# Patient Record
Sex: Male | Born: 1955 | Race: Black or African American | Hispanic: No | Marital: Married | Smoking: Never smoker
Health system: Southern US, Community
[De-identification: ages and names within clinical notes are randomized; demographics above are authoritative.]

## PROBLEM LIST (undated history)

## (undated) DIAGNOSIS — E785 Hyperlipidemia, unspecified: Secondary | ICD-10-CM

## (undated) DIAGNOSIS — I10 Essential (primary) hypertension: Secondary | ICD-10-CM

## (undated) DIAGNOSIS — E119 Type 2 diabetes mellitus without complications: Secondary | ICD-10-CM

## (undated) HISTORY — DX: Hyperlipidemia, unspecified: E78.5

## (undated) HISTORY — PX: NO PAST SURGERIES: SHX2092

## (undated) HISTORY — DX: Essential (primary) hypertension: I10

## (undated) HISTORY — DX: Type 2 diabetes mellitus without complications: E11.9

---

## 2003-08-04 ENCOUNTER — Other Ambulatory Visit: Payer: Self-pay

## 2005-02-22 ENCOUNTER — Emergency Department: Payer: Self-pay | Admitting: Emergency Medicine

## 2005-08-13 ENCOUNTER — Emergency Department: Payer: Self-pay | Admitting: Emergency Medicine

## 2006-07-15 ENCOUNTER — Emergency Department: Payer: Self-pay | Admitting: Emergency Medicine

## 2008-09-17 ENCOUNTER — Emergency Department: Payer: Self-pay | Admitting: Emergency Medicine

## 2009-05-31 ENCOUNTER — Emergency Department: Payer: Self-pay | Admitting: Emergency Medicine

## 2009-08-01 ENCOUNTER — Emergency Department: Payer: Self-pay | Admitting: Internal Medicine

## 2009-12-04 ENCOUNTER — Inpatient Hospital Stay: Payer: Self-pay | Admitting: Internal Medicine

## 2009-12-08 LAB — PATHOLOGY REPORT

## 2009-12-12 ENCOUNTER — Emergency Department: Payer: Self-pay | Admitting: Internal Medicine

## 2010-02-14 ENCOUNTER — Ambulatory Visit: Payer: Self-pay | Admitting: Gastroenterology

## 2010-02-14 LAB — HM COLONOSCOPY

## 2010-02-16 LAB — PATHOLOGY REPORT

## 2010-04-07 ENCOUNTER — Ambulatory Visit: Payer: Self-pay | Admitting: Gastroenterology

## 2010-11-02 ENCOUNTER — Emergency Department: Payer: Self-pay | Admitting: Emergency Medicine

## 2010-12-02 ENCOUNTER — Emergency Department: Payer: Self-pay | Admitting: Internal Medicine

## 2012-08-14 ENCOUNTER — Emergency Department: Payer: Self-pay | Admitting: Emergency Medicine

## 2013-04-25 LAB — TSH: TSH: 1.73 u[IU]/mL (ref <=5.90)

## 2013-05-02 ENCOUNTER — Emergency Department: Payer: Self-pay | Admitting: Emergency Medicine

## 2013-05-02 LAB — CBC WITH DIFFERENTIAL/PLATELET
Basophil #: 0.1 10*3/uL (ref 0.0–0.1)
Basophil %: 0.6 %
Eosinophil #: 0 10*3/uL (ref 0.0–0.7)
Eosinophil %: 0.3 %
HCT: 51.3 % (ref 40.0–52.0)
HGB: 16.3 g/dL (ref 13.0–18.0)
LYMPHS ABS: 2.6 10*3/uL (ref 1.0–3.6)
LYMPHS PCT: 29.5 %
MCH: 25.8 pg — ABNORMAL LOW (ref 26.0–34.0)
MCHC: 31.8 g/dL — AB (ref 32.0–36.0)
MCV: 81 fL (ref 80–100)
MONOS PCT: 5.2 %
Monocyte #: 0.5 x10 3/mm (ref 0.2–1.0)
NEUTROS ABS: 5.7 10*3/uL (ref 1.4–6.5)
Neutrophil %: 64.4 %
Platelet: 228 10*3/uL (ref 150–440)
RBC: 6.33 10*6/uL — AB (ref 4.40–5.90)
RDW: 15.2 % — ABNORMAL HIGH (ref 11.5–14.5)
WBC: 8.9 10*3/uL (ref 3.8–10.6)

## 2013-05-02 LAB — COMPREHENSIVE METABOLIC PANEL
ALBUMIN: 3.5 g/dL (ref 3.4–5.0)
ALT: 39 U/L (ref 12–78)
AST: 22 U/L (ref 15–37)
Alkaline Phosphatase: 66 U/L
Anion Gap: 4 — ABNORMAL LOW (ref 7–16)
BUN: 23 mg/dL — ABNORMAL HIGH (ref 7–18)
Bilirubin,Total: 0.4 mg/dL (ref 0.2–1.0)
CALCIUM: 9.7 mg/dL (ref 8.5–10.1)
CO2: 31 mmol/L (ref 21–32)
CREATININE: 1.51 mg/dL — AB (ref 0.60–1.30)
Chloride: 101 mmol/L (ref 98–107)
EGFR (Non-African Amer.): 51 — ABNORMAL LOW
GFR CALC AF AMER: 59 — AB
Glucose: 330 mg/dL — ABNORMAL HIGH (ref 65–99)
OSMOLALITY: 289 (ref 275–301)
POTASSIUM: 4.7 mmol/L (ref 3.5–5.1)
Sodium: 136 mmol/L (ref 136–145)
Total Protein: 7.6 g/dL (ref 6.4–8.2)

## 2013-05-02 LAB — URINALYSIS, COMPLETE
BACTERIA: NONE SEEN
BILIRUBIN, UR: NEGATIVE
Blood: NEGATIVE
Glucose,UR: >=500 mg/dL (ref 0–75)
Hyaline Cast: 7
KETONE: NEGATIVE
LEUKOCYTE ESTERASE: NEGATIVE
Nitrite: NEGATIVE
Ph: 5 (ref 4.5–8.0)
Protein: NEGATIVE
RBC,UR: 1 /HPF (ref 0–5)
SPECIFIC GRAVITY: 1.021 (ref 1.003–1.030)

## 2013-05-02 LAB — TROPONIN I: Troponin-I: <0.02 ng/mL

## 2013-05-02 LAB — LIPASE, BLOOD: Lipase: 177 U/L (ref 73–393)

## 2013-12-29 DIAGNOSIS — E1142 Type 2 diabetes mellitus with diabetic polyneuropathy: Secondary | ICD-10-CM | POA: Insufficient documentation

## 2013-12-29 DIAGNOSIS — E119 Type 2 diabetes mellitus without complications: Secondary | ICD-10-CM | POA: Insufficient documentation

## 2014-01-01 ENCOUNTER — Emergency Department: Payer: Self-pay | Admitting: Emergency Medicine

## 2014-01-01 LAB — CBC
HCT: 43 % (ref 40.0–52.0)
HGB: 13.5 g/dL (ref 13.0–18.0)
MCH: 25.7 pg — AB (ref 26.0–34.0)
MCHC: 31.3 g/dL — AB (ref 32.0–36.0)
MCV: 82 fL (ref 80–100)
PLATELETS: 175 10*3/uL (ref 150–440)
RBC: 5.24 10*6/uL (ref 4.40–5.90)
RDW: 14.2 % (ref 11.5–14.5)
WBC: 7.4 10*3/uL (ref 3.8–10.6)

## 2014-01-01 LAB — BASIC METABOLIC PANEL
ANION GAP: 9 (ref 7–16)
BUN: 26 mg/dL — AB (ref 7–18)
CALCIUM: 8.2 mg/dL — AB (ref 8.5–10.1)
CHLORIDE: 99 mmol/L (ref 98–107)
Co2: 28 mmol/L (ref 21–32)
Creatinine: 1.52 mg/dL — ABNORMAL HIGH (ref 0.60–1.30)
EGFR (African American): >60
GFR CALC NON AF AMER: 51 — AB
Glucose: 333 mg/dL — ABNORMAL HIGH (ref 65–99)
OSMOLALITY: 290 (ref 275–301)
Potassium: 4.2 mmol/L (ref 3.5–5.1)
Sodium: 136 mmol/L (ref 136–145)

## 2014-01-01 LAB — TROPONIN I: Troponin-I: <0.02 ng/mL

## 2014-01-16 LAB — CBC AND DIFFERENTIAL
HEMATOCRIT: 40 % — AB (ref 41–53)
HEMOGLOBIN: 13.2 g/dL — AB (ref 13.5–17.5)
PLATELETS: 208 10*3/uL (ref 150–399)
WBC: 4.9 10^3/mL

## 2014-01-16 LAB — LIPID PANEL
CHOLESTEROL: 225 mg/dL — AB (ref 0–200)
HDL: 48 mg/dL (ref 35–70)
LDL CALC: 158 mg/dL
TRIGLYCERIDES: 94 mg/dL (ref 40–160)

## 2014-01-16 LAB — BASIC METABOLIC PANEL
BUN: 16 mg/dL (ref 4–21)
Creatinine: 1.1 mg/dL (ref 0.6–1.3)
Glucose: 183 mg/dL
POTASSIUM: 5 mmol/L (ref 3.4–5.3)
SODIUM: 140 mmol/L (ref 137–147)

## 2014-01-16 LAB — HEPATIC FUNCTION PANEL
ALT: 29 U/L (ref 10–40)
AST: 21 U/L (ref 14–40)

## 2014-01-19 ENCOUNTER — Ambulatory Visit: Payer: Self-pay | Admitting: Family Medicine

## 2014-10-29 ENCOUNTER — Encounter: Payer: Self-pay | Admitting: Family Medicine

## 2014-12-21 DIAGNOSIS — E7849 Other hyperlipidemia: Secondary | ICD-10-CM | POA: Insufficient documentation

## 2014-12-21 DIAGNOSIS — I10 Essential (primary) hypertension: Secondary | ICD-10-CM | POA: Insufficient documentation

## 2015-01-13 ENCOUNTER — Encounter: Payer: Self-pay | Admitting: Family Medicine

## 2015-01-13 ENCOUNTER — Ambulatory Visit (INDEPENDENT_AMBULATORY_CARE_PROVIDER_SITE_OTHER): Payer: 59 | Admitting: Family Medicine

## 2015-01-13 VITALS — BP 112/70 | HR 110 | Temp 97.9°F | Resp 16 | Ht 72.0 in | Wt 264.0 lb

## 2015-01-13 DIAGNOSIS — E785 Hyperlipidemia, unspecified: Secondary | ICD-10-CM | POA: Diagnosis not present

## 2015-01-13 DIAGNOSIS — I10 Essential (primary) hypertension: Secondary | ICD-10-CM

## 2015-01-13 DIAGNOSIS — E1169 Type 2 diabetes mellitus with other specified complication: Secondary | ICD-10-CM

## 2015-01-13 DIAGNOSIS — Z6829 Body mass index (BMI) 29.0-29.9, adult: Secondary | ICD-10-CM | POA: Diagnosis not present

## 2015-01-13 DIAGNOSIS — G4700 Insomnia, unspecified: Secondary | ICD-10-CM | POA: Insufficient documentation

## 2015-01-13 DIAGNOSIS — R748 Abnormal levels of other serum enzymes: Secondary | ICD-10-CM | POA: Insufficient documentation

## 2015-01-13 DIAGNOSIS — K259 Gastric ulcer, unspecified as acute or chronic, without hemorrhage or perforation: Secondary | ICD-10-CM | POA: Insufficient documentation

## 2015-01-13 DIAGNOSIS — M1990 Unspecified osteoarthritis, unspecified site: Secondary | ICD-10-CM | POA: Insufficient documentation

## 2015-01-13 DIAGNOSIS — E119 Type 2 diabetes mellitus without complications: Secondary | ICD-10-CM | POA: Insufficient documentation

## 2015-01-13 DIAGNOSIS — S39012A Strain of muscle, fascia and tendon of lower back, initial encounter: Secondary | ICD-10-CM | POA: Insufficient documentation

## 2015-01-13 DIAGNOSIS — D649 Anemia, unspecified: Secondary | ICD-10-CM | POA: Insufficient documentation

## 2015-01-13 MED ORDER — ATORVASTATIN CALCIUM 40 MG PO TABS: 40.0000 mg | ORAL_TABLET | Freq: Every day | ORAL | Status: DC

## 2015-01-13 NOTE — Progress Notes (Signed)
Patient ID: Sean Roy, male   DOB: 05-24-1955, 59 y.o.   MRN: [id number]        Patient: Sean Roy Male    DOB: 05-24-1955   59 y.o.   MRN: [id number] Visit Date: 01/13/2015  Today's Provider: Lorie PhenixNancy Parissa Chiao, MD   Chief Complaint  Patient presents with  . Obesity   Subjective:    HPI Obesity: Patient complains of obesity. Patient cites health as reasons for wanting to lose weight. Patient reports he  needs form filled out for insurance. Patient reports he is very active at work. Patient reports his diet in general is healthy. Patient reports that he would like to lose around 25 lbs.    Has been taking his mediation for diabetes.  Has had no low sugars. Is not checking his sugars regularly. Has not followed up with endocrinology.  Really does not know how his diabetes has been.   Also, ran out of his cholesterol medication and has not taken it recently.  Has not had his labs done recently.      Lab Results  Component Value Date   WBC 4.9 01/16/2014   HGB 13.2* 01/16/2014   HCT 40* 01/16/2014   PLT 208 01/16/2014   GLUCOSE 333* 01/01/2014   CHOL 225* 01/16/2014   TRIG 94 01/16/2014   HDL 48 01/16/2014   LDLCALC 158 01/16/2014   ALT 29 01/16/2014   AST 21 01/16/2014   NA 140 01/16/2014   K 5.0 01/16/2014   CL 99 01/01/2014   CREATININE 1.1 01/16/2014   BUN 16 01/16/2014   CO2 28 01/01/2014   TSH 1.73 04/25/2013     No Known Allergies Previous Medications   ATORVASTATIN (LIPITOR) 40 MG TABLET    Take 40 mg by mouth daily at 6 PM.    GLUCOSE BLOOD (ONE TOUCH ULTRA TEST) TEST STRIP    Use 1 strip  once a day  [DX 250.00]   LISINOPRIL-HYDROCHLOROTHIAZIDE (PRINZIDE,ZESTORETIC) 20-12.5 MG TABLET    Take 1 tablet by mouth. Two times a day   METFORMIN (GLUCOPHAGE) 1000 MG TABLET    Take 1,000 mg by mouth daily with breakfast.     Review of Systems  Constitutional: Negative.   Respiratory: Negative.   Cardiovascular: Negative.   Endocrine: Negative.     Psychiatric/Behavioral: Negative.     Social History  Substance Use Topics  . Smoking status: Never Smoker   . Smokeless tobacco: Never Used  . Alcohol Use: 1.8 oz/week    3 Shots of liquor per week     Comment: occasional   Objective:   BP 112/70 mmHg  Pulse 110  Temp(Src) 97.9 F (36.6 C) (Oral)  Resp 16  Ht 6' (1.829 m)  Wt 264 lb (119.75 kg)  BMI 35.80 kg/m2  SpO2 97%  Physical Exam  Constitutional: He is oriented to person, place, and time. He appears well-developed and well-nourished.  Cardiovascular: Normal rate and regular rhythm.   Pulmonary/Chest: Effort normal and breath sounds normal.  Neurological: He is alert and oriented to person, place, and time.  Psychiatric: He has a normal mood and affect. His behavior is normal. Judgment and thought content normal.      Assessment & Plan:     1. Body mass index (BMI) of 29.0-29.9 in adult Elevated. Motivated to loose weight. Will eat healthy and exercise. Monitor his blood sugars.    2. Essential hypertension Stable. Continue current medication and plan of care.  - CBC with Differential/Platelet  3.  Type 2 diabetes mellitus with other specified complication (HCC) Has not followed up with endocrinology as he was supposed to Stressed again the seriousness of uncontrolled diabetes. Has a long history of non-compliance. Not sure to emphasize the importance of compliance.  Will check labs. - Hemoglobin A1c  4. HLD (hyperlipidemia) Stable.  Will refill medication and check labs.   - Lipid panel - Comprehensive metabolic panel - atorvastatin (LIPITOR) 40 MG tablet; Take 1 tablet (40 mg total) by mouth daily at 6 PM.  Dispense: 90 tablet; Refill: 3    Lorie Phenix, MD  [facility]

## 2015-01-22 ENCOUNTER — Other Ambulatory Visit: Payer: Self-pay | Admitting: Family Medicine

## 2015-01-22 ENCOUNTER — Telehealth: Payer: Self-pay

## 2015-01-22 DIAGNOSIS — E785 Hyperlipidemia, unspecified: Secondary | ICD-10-CM

## 2015-01-22 LAB — LIPID PANEL
CHOLESTEROL TOTAL: 251 mg/dL — AB (ref 100–199)
Chol/HDL Ratio: 6.6 ratio units — ABNORMAL HIGH (ref 0.0–5.0)
HDL: 38 mg/dL — AB (ref >39)
LDL Calculated: 191 mg/dL — ABNORMAL HIGH (ref 0–99)
TRIGLYCERIDES: 112 mg/dL (ref 0–149)
VLDL Cholesterol Cal: 22 mg/dL (ref 5–40)

## 2015-01-22 LAB — CBC WITH DIFFERENTIAL/PLATELET
BASOS: 1 %
Basophils Absolute: 0 10*3/uL (ref 0.0–0.2)
EOS (ABSOLUTE): 0.2 10*3/uL (ref 0.0–0.4)
EOS: 4 %
HEMATOCRIT: 40.5 % (ref 37.5–51.0)
HEMOGLOBIN: 13.2 g/dL (ref 12.6–17.7)
Immature Grans (Abs): 0 10*3/uL (ref 0.0–0.1)
Immature Granulocytes: 0 %
LYMPHS ABS: 3.2 10*3/uL — AB (ref 0.7–3.1)
Lymphs: 54 %
MCH: 26.1 pg — AB (ref 26.6–33.0)
MCHC: 32.6 g/dL (ref 31.5–35.7)
MCV: 80 fL (ref 79–97)
MONOCYTES: 7 %
MONOS ABS: 0.4 10*3/uL (ref 0.1–0.9)
NEUTROS ABS: 2 10*3/uL (ref 1.4–7.0)
Neutrophils: 34 %
Platelets: 201 10*3/uL (ref 150–379)
RBC: 5.05 x10E6/uL (ref 4.14–5.80)
RDW: 14.3 % (ref 12.3–15.4)
WBC: 5.9 10*3/uL (ref 3.4–10.8)

## 2015-01-22 LAB — COMPREHENSIVE METABOLIC PANEL
A/G RATIO: 1.4 (ref 1.1–2.5)
ALT: 32 IU/L (ref 0–44)
AST: 22 IU/L (ref 0–40)
Albumin: 4.3 g/dL (ref 3.5–5.5)
Alkaline Phosphatase: 53 IU/L (ref 39–117)
BILIRUBIN TOTAL: 0.3 mg/dL (ref 0.0–1.2)
BUN/Creatinine Ratio: 20 (ref 9–20)
BUN: 25 mg/dL — ABNORMAL HIGH (ref 6–24)
CALCIUM: 9.4 mg/dL (ref 8.7–10.2)
CHLORIDE: 99 mmol/L (ref 97–106)
CO2: 24 mmol/L (ref 18–29)
Creatinine, Ser: 1.26 mg/dL (ref 0.76–1.27)
GFR calc non Af Amer: 63 mL/min/{1.73_m2} (ref >59)
GFR, EST AFRICAN AMERICAN: 73 mL/min/{1.73_m2} (ref >59)
GLOBULIN, TOTAL: 3.1 g/dL (ref 1.5–4.5)
Glucose: 201 mg/dL — ABNORMAL HIGH (ref 65–99)
POTASSIUM: 4.6 mmol/L (ref 3.5–5.2)
SODIUM: 139 mmol/L (ref 136–144)
TOTAL PROTEIN: 7.4 g/dL (ref 6.0–8.5)

## 2015-01-22 LAB — HEMOGLOBIN A1C
Est. average glucose Bld gHb Est-mCnc: 269 mg/dL
Hgb A1c MFr Bld: 11 % — ABNORMAL HIGH (ref 4.8–5.6)

## 2015-01-22 MED ORDER — ATORVASTATIN CALCIUM 40 MG PO TABS: 40.0000 mg | ORAL_TABLET | Freq: Every day | ORAL | Status: DC

## 2015-01-22 NOTE — Telephone Encounter (Signed)
-----   Message from Lorie PhenixNancy Maloney, MD sent at 01/22/2015  9:57 AM EDT ----- Labs look terrible as suspected. Restart cholesterol medication and recheck labs in 6 weeks. Blood sugar is high at 11.  Follow with endocrinologist as scheduled. Thanks.

## 2015-01-22 NOTE — Telephone Encounter (Signed)
Pt stated that we sent the RX for atorvastatin (LIPITOR) 40 MG tablet to [facility] as he requested but [facility] advised pt that he needs to use [facility] mail order for his insurance to cover the medication. Pt is requesting we send it to [facility]. Thanks TNP

## 2015-01-22 NOTE — Telephone Encounter (Signed)
Pt advised as directed below.  He has already started his cholesterol medication.  He agreed to call back around mid December to recheck labs.  He has not set up an appointment yet with endocrinology; but he says he will give them a call.   Thanks,   -Vernona RiegerLaura

## 2015-04-28 ENCOUNTER — Telehealth: Payer: Self-pay | Admitting: Family Medicine

## 2015-04-28 NOTE — Telephone Encounter (Signed)
It appears patient is past due for follow up appointment and labs. Patient scheduled for follow up on 05/04/2015.

## 2015-04-28 NOTE — Telephone Encounter (Signed)
Pt would like to know when he should return for a f/u on his cholesterol and if he will need blood work. Please advise. Thanks TNP

## 2015-05-04 ENCOUNTER — Ambulatory Visit (INDEPENDENT_AMBULATORY_CARE_PROVIDER_SITE_OTHER): Payer: 59 | Admitting: Family Medicine

## 2015-05-04 ENCOUNTER — Encounter: Payer: Self-pay | Admitting: Family Medicine

## 2015-05-04 VITALS — BP 118/84 | HR 84 | Temp 98.0°F | Resp 16 | Wt 260.0 lb

## 2015-05-04 DIAGNOSIS — Z23 Encounter for immunization: Secondary | ICD-10-CM | POA: Diagnosis not present

## 2015-05-04 DIAGNOSIS — R748 Abnormal levels of other serum enzymes: Secondary | ICD-10-CM | POA: Diagnosis not present

## 2015-05-04 DIAGNOSIS — E785 Hyperlipidemia, unspecified: Secondary | ICD-10-CM

## 2015-05-04 NOTE — Progress Notes (Signed)
Subjective:    Patient ID: Sean Roy, male    DOB: 02-Oct-1955, 60 y.o.   MRN: [id number]  Hyperlipidemia This is a chronic problem. Recent lipid tests were reviewed and are high (Last checked 01/21/2015. Total- 251, Trig- 112, HDL- 38, LDL- 191). Pertinent negatives include no chest pain, focal sensory loss, focal weakness, leg pain, myalgias or shortness of breath. Current antihyperlipidemic treatment includes statins (Lipitor 40 mg). There are no compliance problems.  Risk factors for coronary artery disease include diabetes mellitus, dyslipidemia, male sex and hypertension.  Hypertension This is a chronic problem. The problem is controlled. Pertinent negatives include no anxiety, blurred vision, chest pain, headaches, malaise/fatigue, neck pain, orthopnea, palpitations, peripheral edema, shortness of breath or sweats. Treatments tried: Lisinopril 20-12.5 mg. The current treatment provides moderate improvement. There are no compliance problems.       Review of Systems  Constitutional: Negative for malaise/fatigue.  Eyes: Negative for blurred vision.  Respiratory: Negative for shortness of breath.   Cardiovascular: Negative for chest pain, palpitations and orthopnea.  Musculoskeletal: Negative for myalgias and neck pain.  Neurological: Negative for focal weakness and headaches.   BP 118/84 mmHg  Pulse 84  Temp(Src) 98 F (36.7 C) (Oral)  Resp 16  Wt 260 lb (117.935 kg)   Patient Active Problem List   Diagnosis Date Noted  . Absolute anemia 01/13/2015  . Body mass index (BMI) of 29.0-29.9 in adult 01/13/2015  . Elevated CK 01/13/2015  . Gastric ulcer 01/13/2015  . HLD (hyperlipidemia) 01/13/2015  . BP (high blood pressure) 01/13/2015  . Cannot sleep 01/13/2015  . Back strain 01/13/2015  . Arthritis, degenerative 01/13/2015  . Essential (primary) hypertension 12/21/2014  . 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 Type 2 diabetes mellitus (HCC)  12/29/2013   Past Medical History  Diagnosis Date  . Diabetes mellitus without complication (HCC)   . Hyperlipidemia   . Hypertension    Current Outpatient Prescriptions on File Prior to Visit  Medication Sig  . atorvastatin (LIPITOR) 40 MG tablet Take 1 tablet (40 mg total) by mouth daily.  Marland Kitchen glucose blood (ONE TOUCH ULTRA TEST) test strip Use 1 strip  once a day  [DX 250.00]  . lisinopril-hydrochlorothiazide (PRINZIDE,ZESTORETIC) 20-12.5 MG tablet Take 1 tablet by mouth. Two times a day  . metFORMIN (GLUCOPHAGE) 1000 MG tablet Take 1,000 mg by mouth daily with breakfast.    No current facility-administered medications on file prior to visit.   No Known Allergies Past Surgical History  Procedure Laterality Date  . No past surgeries     Social History   Social History  . Marital Status: Married    Spouse Name: N/A  . Number of Children: N/A  . Years of Education: N/A   Occupational History  . Not on file.   Social History Main Topics  . Smoking status: Never Smoker   . Smokeless tobacco: Never Used  . Alcohol Use: 1.8 oz/week    3 Shots of liquor per week     Comment: occasional  . Drug Use: No  . Sexual Activity: Not on file   Other Topics Concern  . Not on file   Social History Narrative   Family History  Problem Relation Age of Onset  . Healthy Sister   . Healthy Brother   . Healthy Brother   . Healthy Sister       Objective:   Physical Exam  Constitutional: He is oriented to person, place, and time. He appears well-developed  and well-nourished.  Cardiovascular: Normal rate and regular rhythm.   Pulmonary/Chest: Effort normal and breath sounds normal.  Neurological: He is alert and oriented to person, place, and time.  Psychiatric: He has a normal mood and affect. His behavior is normal. Judgment and thought content normal.   BP 118/84 mmHg  Pulse 84  Temp(Src) 98 F (36.7 C) (Oral)  Resp 16  Wt 260 lb (117.935 kg)     Assessment & Plan:  1.  HLD (hyperlipidemia) Condition is stable. Please continue current medication and  plan of care as noted.  Will check labs.   - Comprehensive metabolic panel - Lipid panel  2. Elevated CK Will  Check today. Always.   - CK (Creatine Kinase)  3. Need for pneumococcal vaccination Given today.  - Pneumococcal polysaccharide vaccine 23-valent greater than or equal to 2yo subcutaneous/IM   Patient was seen and examined by Leo Grosser, MD, and note scribed by Allene Dillon, CMA. I have reviewed the document for accuracy and completeness and I agree with above. Leo Grosser, MD   Lorie Phenix, MD

## 2015-05-05 ENCOUNTER — Other Ambulatory Visit: Payer: Self-pay | Admitting: Family Medicine

## 2015-05-06 ENCOUNTER — Telehealth: Payer: Self-pay

## 2015-05-06 LAB — COMPREHENSIVE METABOLIC PANEL
ALBUMIN: 4.1 g/dL (ref 3.5–5.5)
ALK PHOS: 57 IU/L (ref 39–117)
ALT: 27 IU/L (ref 0–44)
AST: 23 IU/L (ref 0–40)
Albumin/Globulin Ratio: 1.3 (ref 1.1–2.5)
BUN/Creatinine Ratio: 24 — ABNORMAL HIGH (ref 9–20)
BUN: 24 mg/dL (ref 6–24)
CHLORIDE: 102 mmol/L (ref 96–106)
CO2: 24 mmol/L (ref 18–29)
CREATININE: 1.01 mg/dL (ref 0.76–1.27)
Calcium: 9.6 mg/dL (ref 8.7–10.2)
GFR calc Af Amer: 95 mL/min/{1.73_m2} (ref >59)
GFR calc non Af Amer: 82 mL/min/{1.73_m2} (ref >59)
GLUCOSE: 80 mg/dL (ref 65–99)
Globulin, Total: 3.2 g/dL (ref 1.5–4.5)
Potassium: 4.8 mmol/L (ref 3.5–5.2)
Sodium: 143 mmol/L (ref 134–144)
Total Protein: 7.3 g/dL (ref 6.0–8.5)

## 2015-05-06 LAB — LIPID PANEL
CHOLESTEROL TOTAL: 129 mg/dL (ref 100–199)
Chol/HDL Ratio: 3.6 ratio units (ref 0.0–5.0)
HDL: 36 mg/dL — ABNORMAL LOW (ref >39)
LDL CALC: 78 mg/dL (ref 0–99)
TRIGLYCERIDES: 77 mg/dL (ref 0–149)
VLDL CHOLESTEROL CAL: 15 mg/dL (ref 5–40)

## 2015-05-06 LAB — CK: Total CK: 731 U/L (ref 24–204)

## 2015-05-06 NOTE — Telephone Encounter (Signed)
-----   Message from Lorie Phenix, MD sent at 05/06/2015  2:14 PM EST ----- Labs look great except CK is up like previous. Would stop  Atorvastatin short term and recheck Ck and Ck MB in 2 weeks. Will decide what to do about statin after that.  Very pleased got his labs.  Look great otherwise.   Labs do not have to be fasting. Thanks.

## 2015-05-06 NOTE — Telephone Encounter (Signed)
Advised pt of lab results. Pt verbally acknowledges understanding. Sean Roy, CMA   

## 2015-05-19 ENCOUNTER — Other Ambulatory Visit: Payer: Self-pay

## 2015-05-19 ENCOUNTER — Telehealth: Payer: Self-pay | Admitting: Family Medicine

## 2015-05-19 DIAGNOSIS — R748 Abnormal levels of other serum enzymes: Secondary | ICD-10-CM

## 2015-05-19 NOTE — Telephone Encounter (Signed)
Pt is requesting a call back.  ZO#[phone]/[phone]

## 2015-05-19 NOTE — Telephone Encounter (Signed)
Patient is requesting a lab slip to have labs rechecked from 05/05/2015. Lab slip printed at front desk for pickup.

## 2015-05-21 LAB — CK TOTAL AND CKMB (NOT AT ARMC)
CK TOTAL: 307 U/L — AB (ref 24–204)
CK-MB Index: 4.8 ng/mL (ref 0.0–10.4)

## 2015-05-21 LAB — CK: CK TOTAL: 307 U/L — AB (ref 24–204)

## 2015-05-25 ENCOUNTER — Ambulatory Visit (INDEPENDENT_AMBULATORY_CARE_PROVIDER_SITE_OTHER): Payer: 59 | Admitting: Family Medicine

## 2015-05-25 ENCOUNTER — Encounter: Payer: Self-pay | Admitting: Family Medicine

## 2015-05-25 VITALS — BP 128/84 | HR 88 | Temp 98.4°F | Resp 16 | Wt 259.0 lb

## 2015-05-25 DIAGNOSIS — M79674 Pain in right toe(s): Secondary | ICD-10-CM | POA: Diagnosis not present

## 2015-05-25 DIAGNOSIS — I10 Essential (primary) hypertension: Secondary | ICD-10-CM

## 2015-05-25 MED ORDER — LISINOPRIL 40 MG PO TABS: 40.0000 mg | ORAL_TABLET | Freq: Every day | ORAL | Status: DC

## 2015-05-25 MED ORDER — NAPROXEN 500 MG PO TABS: 500.0000 mg | ORAL_TABLET | Freq: Two times a day (BID) | ORAL | Status: DC

## 2015-05-25 NOTE — Progress Notes (Signed)
Subjective:    Patient ID: Sean Roy, male    DOB: [birth year], 60 y.o.   MRN: [id number]  Foot Pain This is a new problem. The current episode started in the past 7 days (x 3 days. ). The problem occurs constantly. The problem has been rapidly improving. Associated symptoms include arthralgias and joint swelling. Pertinent negatives include no swollen glands or weakness. The symptoms are aggravated by standing, exertion and walking. He has tried nothing for the symptoms. Improvement on treatment: Has had gout in the past.    Pain is located on right great toe. Denies injury.  Has had it in the same place 3 to 4 years ago.  Has been trying to eat better. Taking his medication regularly.   Really trying to take better care of himself. Has good blood pressure today.      Review of Systems  Constitutional: Negative for activity change and appetite change.  Musculoskeletal: Positive for joint swelling and arthralgias.  Neurological: Negative for weakness.   BP 128/84 mmHg  Pulse 88  Temp(Src) 98.4 F (36.9 C) (Oral)  Resp 16  Wt 259 lb (117.482 kg)   Patient Active Problem List   Diagnosis Date Noted  . Absolute anemia 01/13/2015  . Body mass index (BMI) of 29.0-29.9 in adult 01/13/2015  . Elevated CK 01/13/2015  . Gastric ulcer 01/13/2015  . HLD (hyperlipidemia) 01/13/2015  . Cannot sleep 01/13/2015  . Back strain 01/13/2015  . Arthritis, degenerative 01/13/2015  . Essential (primary) hypertension 12/21/2014  . 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 Type 2 diabetes mellitus ([place]) 12/29/2013   Past Medical History  Diagnosis Date  . Diabetes mellitus without complication ([place])   . Hyperlipidemia   . Hypertension    Current Outpatient Prescriptions on File Prior to Visit  Medication Sig  . Dulaglutide (TRULICITY) 1.5 EY/8.1KG SOPN Inject into the skin.  Marland Kitchen glucose blood (ONE TOUCH ULTRA TEST) test strip Use 1 strip  once a day  [DX 250.00]  .  lisinopril-hydrochlorothiazide (PRINZIDE,ZESTORETIC) 20-12.5 MG tablet Take 1 tablet by mouth. Two times a day  . metFORMIN (GLUCOPHAGE) 1000 MG tablet Take 1,000 mg by mouth daily with breakfast.   . sildenafil (REVATIO) 20 MG tablet Take by mouth.  Marland Kitchen atorvastatin (LIPITOR) 40 MG tablet Take 1 tablet (40 mg total) by mouth daily. (Patient not taking: Reported on 05/25/2015)   No current facility-administered medications on file prior to visit.   No Known Allergies Past Surgical History  Procedure Laterality Date  . No past surgeries     Social History   Social History  . Marital Status: Married    Spouse Name: N/A  . Number of Children: N/A  . Years of Education: N/A   Occupational History  . Not on file.   Social History Main Topics  . Smoking status: Never Smoker   . Smokeless tobacco: Never Used  . Alcohol Use: 1.8 oz/week    3 Shots of liquor per week     Comment: occasional  . Drug Use: No  . Sexual Activity: Not on file   Other Topics Concern  . Not on file   Social History Narrative   Family History  Problem Relation Age of Onset  . Healthy Sister   . Healthy Brother   . Healthy Brother   . Healthy Sister        Objective:   Physical Exam  Constitutional: He is oriented to person, place, and time. He appears well-developed and  well-nourished.  Cardiovascular: Normal rate and regular rhythm.   Pulmonary/Chest: Effort normal and breath sounds normal.  Musculoskeletal: He exhibits edema and tenderness.  Tender and swollen MCP on right great toe.   Neurological: He is alert and oriented to person, place, and time.  Psychiatric: He has a normal mood and affect. His behavior is normal. Judgment and thought content normal.  BP 128/84 mmHg  Pulse 88  Temp(Src) 98.4 F (36.9 C) (Oral)  Resp 16  Wt 259 lb (117.482 kg)     Assessment & Plan:  1. Pain of great toe, right New problem.  Worsening. Will check labs and treat with Naprosyn.  Patient instructed to  call back if condition worsens or does not improve.    - Uric acid - Comprehensive Metabolic Panel (CMET) - CBC with Differential/Platelet - Sed Rate (ESR) - naproxen (NAPROSYN) 500 MG tablet; Take 1 tablet (500 mg total) by mouth 2 (two) times daily with a meal. As needed  Dispense: 30 tablet; Refill: 0  2. Essential hypertension Change to Lisinopril and stop HCTZ secondary to gout.   - lisinopril (PRINIVIL,ZESTRIL) 40 MG tablet; Take 1 tablet (40 mg total) by mouth daily.  Dispense: 90 tablet; Refill: 3   Margarita Rana, MD

## 2015-05-25 NOTE — Patient Instructions (Signed)

## 2015-05-26 LAB — COMPREHENSIVE METABOLIC PANEL
ALT: 30 IU/L (ref 0–44)
AST: 21 IU/L (ref 0–40)
Albumin/Globulin Ratio: 1.2 (ref 1.1–2.5)
Albumin: 4.3 g/dL (ref 3.5–5.5)
Alkaline Phosphatase: 62 IU/L (ref 39–117)
BUN/Creatinine Ratio: 16 (ref 9–20)
BUN: 16 mg/dL (ref 6–24)
Bilirubin Total: 0.3 mg/dL (ref 0.0–1.2)
CALCIUM: 10.1 mg/dL (ref 8.7–10.2)
CO2: 25 mmol/L (ref 18–29)
CREATININE: 0.97 mg/dL (ref 0.76–1.27)
Chloride: 99 mmol/L (ref 96–106)
GFR calc Af Amer: 100 mL/min/{1.73_m2} (ref >59)
GFR, EST NON AFRICAN AMERICAN: 86 mL/min/{1.73_m2} (ref >59)
Globulin, Total: 3.6 g/dL (ref 1.5–4.5)
Glucose: 79 mg/dL (ref 65–99)
Potassium: 4.5 mmol/L (ref 3.5–5.2)
Sodium: 142 mmol/L (ref 134–144)
Total Protein: 7.9 g/dL (ref 6.0–8.5)

## 2015-05-26 LAB — CBC WITH DIFFERENTIAL/PLATELET
BASOS: 0 %
Basophils Absolute: 0 10*3/uL (ref 0.0–0.2)
EOS (ABSOLUTE): 0.2 10*3/uL (ref 0.0–0.4)
Eos: 2 %
Hematocrit: 41.4 % (ref 37.5–51.0)
Hemoglobin: 13.5 g/dL (ref 12.6–17.7)
IMMATURE GRANS (ABS): 0 10*3/uL (ref 0.0–0.1)
IMMATURE GRANULOCYTES: 0 %
LYMPHS: 41 %
Lymphocytes Absolute: 3 10*3/uL (ref 0.7–3.1)
MCH: 25.8 pg — ABNORMAL LOW (ref 26.6–33.0)
MCHC: 32.6 g/dL (ref 31.5–35.7)
MCV: 79 fL (ref 79–97)
Monocytes Absolute: 0.5 10*3/uL (ref 0.1–0.9)
Monocytes: 6 %
NEUTROS PCT: 51 %
Neutrophils Absolute: 3.8 10*3/uL (ref 1.4–7.0)
PLATELETS: 219 10*3/uL (ref 150–379)
RBC: 5.24 x10E6/uL (ref 4.14–5.80)
RDW: 14.4 % (ref 12.3–15.4)
WBC: 7.5 10*3/uL (ref 3.4–10.8)

## 2015-05-26 LAB — SEDIMENTATION RATE: Sed Rate: 6 mm/hr (ref 0–30)

## 2015-05-26 LAB — URIC ACID: URIC ACID: 9.6 mg/dL — AB (ref 3.7–8.6)

## 2015-07-08 LAB — MICROALBUMIN, URINE: Microalb, Ur: <3

## 2015-07-19 ENCOUNTER — Ambulatory Visit (INDEPENDENT_AMBULATORY_CARE_PROVIDER_SITE_OTHER): Payer: 59 | Admitting: Family Medicine

## 2015-07-19 ENCOUNTER — Encounter: Payer: Self-pay | Admitting: Family Medicine

## 2015-07-19 VITALS — BP 110/76 | HR 76 | Temp 98.2°F | Resp 16 | Ht 72.0 in | Wt 259.0 lb

## 2015-07-19 DIAGNOSIS — Z0000 Encounter for general adult medical examination without abnormal findings: Secondary | ICD-10-CM

## 2015-07-19 DIAGNOSIS — Z125 Encounter for screening for malignant neoplasm of prostate: Secondary | ICD-10-CM | POA: Diagnosis not present

## 2015-07-19 NOTE — Progress Notes (Signed)
Patient ID: Sean Roy, male   DOB: July 11, 1955, 60 y.o.   MRN: [id number]       Patient: Sean Roy, Male    DOB: Aug 30, 1955, 60 y.o.   MRN: [id number] Visit Date: 07/19/2015  Today's Provider: Lorie Phenix, MD   Chief Complaint  Patient presents with  . Annual Exam   Subjective:    Annual physical exam [name] is a 60 y.o. male who presents today for health maintenance and complete physical. He feels well. He reports exercising daily. He reports he is sleeping well.  02/14/10 Colonoscopy-WNL, Dr. Ricki Rodriguez Has been following up with endocrinology and podiatry.  Also, has follow up with cardiology this summer.   -----------------------------------------------------------------   Review of Systems  Constitutional: Negative.   HENT: Negative.   Eyes: Negative.   Respiratory: Negative.   Cardiovascular: Negative.   Gastrointestinal: Negative.   Endocrine: Negative.   Genitourinary: Negative.   Musculoskeletal: Positive for gait problem.  Skin: Negative.   Allergic/Immunologic: Negative.   Neurological: Negative.   Hematological: Negative.   Psychiatric/Behavioral: Negative.     Social History      He  reports that he has never smoked. He has never used smokeless tobacco. He reports that he drinks about 1.8 oz of alcohol per week. He reports that he does not use illicit drugs.       Social History   Social History  . Marital Status: Married    Spouse Name: N/A  . Number of Children: N/A  . Years of Education: N/A   Social History Main Topics  . Smoking status: Never Smoker   . Smokeless tobacco: Never Used  . Alcohol Use: 1.8 oz/week    3 Shots of liquor per week     Comment: occasional  . Drug Use: No  . Sexual Activity: Not Asked   Other Topics Concern  . None   Social History Narrative    Past Medical History  Diagnosis Date  . Diabetes mellitus without complication (HCC)   . Hyperlipidemia   . Hypertension      Patient Active  Problem List   Diagnosis Date Noted  . Absolute anemia 01/13/2015  . Body mass index (BMI) of 29.0-29.9 in adult 01/13/2015  . Elevated CK 01/13/2015  . Gastric ulcer 01/13/2015  . HLD (hyperlipidemia) 01/13/2015  . Cannot sleep 01/13/2015  . Back strain 01/13/2015  . Arthritis, degenerative 01/13/2015  . Essential (primary) hypertension 12/21/2014  . 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 Type 2 diabetes mellitus (HCC) 12/29/2013    Past Surgical History  Procedure Laterality Date  . No past surgeries      Family History        Family Status  Relation Status Death Age  . Mother Deceased 30's    Cause of death unknown  . Father Alive   . Sister Alive   . Brother Alive   . Brother Alive   . Sister Alive         His family history includes Healthy in his brother, brother, sister, and sister.    No Known Allergies  Previous Medications   ATORVASTATIN (LIPITOR) 40 MG TABLET    Take 1 tablet (40 mg total) by mouth daily.   DULAGLUTIDE (TRULICITY) 1.5 MG/0.5ML SOPN    Inject into the skin.   GABAPENTIN (NEURONTIN) 300 MG CAPSULE    Take 300 mg by mouth at bedtime.   GLUCOSE BLOOD (ONE TOUCH ULTRA TEST) TEST STRIP    Use 1 strip  once a day  [DX 250.00]   GLYBURIDE-METFORMIN (GLUCOVANCE) 2.5-500 MG TABLET    Take 2 tablets by mouth 2 (two) times daily with a meal.   HYDROCORTISONE 2.5 % CREAM    Apply 1 application topically.   KETOCONAZOLE (NIZORAL) 2 % CREAM    Apply 1 application topically daily.   LISINOPRIL (PRINIVIL,ZESTRIL) 40 MG TABLET    Take 1 tablet (40 mg total) by mouth daily.   SILDENAFIL (REVATIO) 20 MG TABLET    Take by mouth.    Patient Care Team: Lorie PhenixNancy Kerrilyn Azbill, MD as PCP - General (Family Medicine)     Objective:   Vitals: BP 110/76 mmHg  Pulse 76  Temp(Src) 98.2 F (36.8 C) (Oral)  Resp 16  Ht 6' (1.829 m)  Wt 259 lb (117.482 kg)  BMI 35.12 kg/m2   Physical Exam  Constitutional: He is oriented to person, place, and  time. He appears well-developed and well-nourished.  HENT:  Head: Normocephalic and atraumatic.  Right Ear: External ear normal.  Left Ear: External ear normal.  Nose: Nose normal.  Mouth/Throat: Oropharynx is clear and moist.  Eyes: Conjunctivae and EOM are normal. Pupils are equal, round, and reactive to light.  Neck: Normal range of motion. Neck supple.  Cardiovascular: Normal rate, regular rhythm and normal heart sounds.   Pulmonary/Chest: Effort normal and breath sounds normal.  Abdominal: Soft. Bowel sounds are normal.  Neurological: He is alert and oriented to person, place, and time.  Skin: Skin is warm and dry.  Psychiatric: He has a normal mood and affect. His behavior is normal. Judgment and thought content normal.     Depression Screen PHQ 2/9 Scores 07/19/2015  PHQ - 2 Score 0      Assessment & Plan:     Routine Health Maintenance and Physical Exam  Exercise Activities and Dietary recommendations Goals    None      Immunization History  Administered Date(s) Administered  . Pneumococcal Polysaccharide-23 05/04/2015  . Td 03/26/2004       1. Annual physical exam Stable. Patient advised to continue eating healthy and exercise daily.  2. Prostate cancer screening - PSA  Patient seen and examined by Dr. Leo GrosserNancy J.. Violet Cart, and note scribed by Liz BeachSulibeya S. Dimas, CMA.  I have reviewed the document for accuracy and completeness and I agree with above. Leo Grosser- Jeramine Delis J. Mario Coronado, MD   Lorie PhenixNancy Lynnex Fulp, MD   --------------------------------------------------------------------

## 2015-07-20 ENCOUNTER — Telehealth: Payer: Self-pay

## 2015-07-20 LAB — PSA: Prostate Specific Ag, Serum: 0.7 ng/mL (ref 0.0–4.0)

## 2015-07-20 NOTE — Telephone Encounter (Signed)
Pt advised.   Thanks,   -Laura  

## 2015-07-20 NOTE — Telephone Encounter (Signed)
-----   Message from Lorie PhenixNancy Maloney, MD sent at 07/20/2015 10:20 AM EDT ----- PSA normal. Please notify patient.  Thanks.

## 2015-07-27 DIAGNOSIS — M1A00X0 Idiopathic chronic gout, unspecified site, without tophus (tophi): Secondary | ICD-10-CM | POA: Insufficient documentation

## 2015-09-16 DIAGNOSIS — M10072 Idiopathic gout, left ankle and foot: Secondary | ICD-10-CM | POA: Insufficient documentation

## 2015-11-30 ENCOUNTER — Emergency Department
Admission: EM | Admit: 2015-11-30 | Discharge: 2015-11-30 | Disposition: A | Discharge status: Home / Self Care | Payer: 59 | Attending: Emergency Medicine | Admitting: Emergency Medicine

## 2015-11-30 ENCOUNTER — Encounter: Payer: Self-pay | Admitting: Emergency Medicine

## 2015-11-30 DIAGNOSIS — E119 Type 2 diabetes mellitus without complications: Secondary | ICD-10-CM | POA: Insufficient documentation

## 2015-11-30 DIAGNOSIS — Z79899 Other long term (current) drug therapy: Secondary | ICD-10-CM | POA: Insufficient documentation

## 2015-11-30 DIAGNOSIS — I10 Essential (primary) hypertension: Secondary | ICD-10-CM | POA: Insufficient documentation

## 2015-11-30 DIAGNOSIS — M25512 Pain in left shoulder: Secondary | ICD-10-CM | POA: Diagnosis present

## 2015-11-30 DIAGNOSIS — M5412 Radiculopathy, cervical region: Secondary | ICD-10-CM | POA: Diagnosis not present

## 2015-11-30 MED ORDER — TRAMADOL HCL 50 MG PO TABS: 50.0000 mg | ORAL_TABLET | Freq: Four times a day (QID) | ORAL | 0 refills | Status: DC | PRN

## 2015-11-30 MED ORDER — ETODOLAC 500 MG PO TABS: 500.0000 mg | ORAL_TABLET | Freq: Two times a day (BID) | ORAL | 0 refills | Status: DC

## 2015-11-30 MED ORDER — KETOROLAC TROMETHAMINE 30 MG/ML IJ SOLN: 30.0000 mg | Freq: Once | INTRAMUSCULAR | Status: DC

## 2015-11-30 MED ORDER — KETOROLAC TROMETHAMINE 30 MG/ML IJ SOLN
30.0000 mg | Freq: Once | INTRAMUSCULAR | Status: AC
  Administered 2015-11-30: 30 mg via INTRAMUSCULAR
  Filled 2015-11-30: qty 1

## 2015-11-30 NOTE — ED Notes (Signed)
Developed some post left shoulder pain about 1 week ago. Unsure of specific injury  States he did lift something heavy   No deformity noted

## 2015-11-30 NOTE — ED Triage Notes (Signed)
Pt states he has not had his BP meds for over a mth.

## 2015-11-30 NOTE — ED Provider Notes (Signed)
[facility]-EMERGENCY DEPARTMENT Provider Note   CSN: [id number] Arrival date & time: 11/30/15  1646     History   Chief Complaint Chief Complaint  Patient presents with  . Shoulder Pain    HPI Sean Roy is a 60 y.o. male presents today for evaluation of burning pain in the left superior scapular border going down the left triceps region. Burning pain isn't present for 4 days. No trauma or injury. Patient denies any weakness in the upper extremities. Patient has had similar symptoms 1 year ago that resolved on their own. Patient's pain is 7 out of 10 today, his pain is constant. He denies any headache, vision changes. Ibuprofen 400 mg last night with no improvement. Pain is increased with left lateral rotation no pain with cervical flexion, extension or right rotation.  HPI  Past Medical History:  Diagnosis Date  . Diabetes mellitus without complication (HCC)   . Hyperlipidemia   . Hypertension     Patient Active Problem List   Diagnosis Date Noted  . Absolute anemia 01/13/2015  . Body mass index (BMI) of 29.0-29.9 in adult 01/13/2015  . Elevated CK 01/13/2015  . Gastric ulcer 01/13/2015  . HLD (hyperlipidemia) 01/13/2015  . Cannot sleep 01/13/2015  . Back strain 01/13/2015  . Arthritis, degenerative 01/13/2015  . Essential (primary) hypertension 12/21/2014  . 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 Type 2 diabetes mellitus (HCC) 12/29/2013    Past Surgical History:  Procedure Laterality Date  . NO PAST SURGERIES         Home Medications    Prior to Admission medications   Medication Sig Start Date End Date Taking? Authorizing Provider  atorvastatin (LIPITOR) 40 MG tablet Take 1 tablet (40 mg total) by mouth daily. 01/22/15   Lorie PhenixNancy Maloney, MD  Dulaglutide (TRULICITY) 1.5 MG/0.5ML SOPN Inject into the skin. 04/15/15   Historical Provider, MD  etodolac (LODINE) 500 MG tablet Take 1 tablet (500 mg total) by mouth 2 (two) times daily.  11/30/15   Evon Slackhomas C Gaines, PA-C  gabapentin (NEURONTIN) 300 MG capsule Take 300 mg by mouth at bedtime.    Historical Provider, MD  glucose blood (ONE TOUCH ULTRA TEST) test strip Use 1 strip  once a day  [DX 250.00]    Historical Provider, MD  glyBURIDE-metformin (GLUCOVANCE) 2.5-500 MG tablet Take 2 tablets by mouth 2 (two) times daily with a meal.    Historical Provider, MD  hydrocortisone 2.5 % cream Apply 1 application topically.    Historical Provider, MD  ketoconazole (NIZORAL) 2 % cream Apply 1 application topically daily.    Historical Provider, MD  lisinopril (PRINIVIL,ZESTRIL) 40 MG tablet Take 1 tablet (40 mg total) by mouth daily. 05/25/15   Lorie PhenixNancy Maloney, MD  sildenafil (REVATIO) 20 MG tablet Take by mouth. 04/06/15 04/05/16  Historical Provider, MD  traMADol (ULTRAM) 50 MG tablet Take 1 tablet (50 mg total) by mouth every 6 (six) hours as needed. 11/30/15   Evon Slackhomas C Gaines, PA-C    Family History Family History  Problem Relation Age of Onset  . Healthy Sister   . Healthy Brother   . Healthy Brother   . Healthy Sister     Social History Social History  Substance Use Topics  . Smoking status: Never Smoker  . Smokeless tobacco: Never Used  . Alcohol use 1.8 oz/week    3 Shots of liquor per week     Comment: occasional     Allergies   Review of patient's allergies indicates  no known allergies.   Review of Systems Review of Systems  Constitutional: Negative.  Negative for activity change, appetite change, chills and fever.  HENT: Negative for congestion, ear pain, mouth sores, rhinorrhea, sinus pressure, sore throat and trouble swallowing.   Eyes: Negative for photophobia, pain and discharge.  Respiratory: Negative for cough, chest tightness and shortness of breath.   Cardiovascular: Negative for chest pain and leg swelling.  Gastrointestinal: Negative for abdominal distention, abdominal pain, diarrhea, nausea and vomiting.  Genitourinary: Negative for difficulty  urinating and dysuria.  Musculoskeletal: Positive for arthralgias (pain, left scapular border superiorly, left triceps). Negative for back pain and gait problem.  Skin: Negative for color change and rash.  Neurological: Negative for dizziness, numbness and headaches.  Hematological: Negative for adenopathy.  Psychiatric/Behavioral: Negative for agitation and behavioral problems.     Physical Exam Updated Vital Signs BP (!) 172/103 (BP Location: Right Arm)   Pulse 84   Temp 98 F (36.7 C) (Oral)   Resp 18   Ht 5\' 11"  (1.803 m)   Wt 115.2 kg   SpO2 99%   BMI 35.43 kg/m   Physical Exam  Constitutional: He appears well-developed and well-nourished.  HENT:  Head: Normocephalic and atraumatic.  Eyes: Conjunctivae are normal.  Neck: Normal range of motion. Neck supple.  Cardiovascular: Normal rate and regular rhythm.   No murmur heard. Pulmonary/Chest: Effort normal and breath sounds normal. No respiratory distress.  Abdominal: Soft. There is no tenderness.  Musculoskeletal:  Cervical Spine: Examination of the cervical spine reveals no bony abnormality, no edema, and no ecchymosis.  There is no step-off.  The patient has full active and passive range of motion of the cervical spine with flexion, extension, and right and left bend with rotation.  There is no crepitus with range of motion exercises.  The patient is non-tender along the spinous process to palpation.  The patient has no paravertebral muscle spasm. There is mild discomfort to the left superior scapular border. The patient has a negative axial compression test.  The patient has a positive left Spurling test.  The patient has a negative overhead arm test for thoracic outlet syndrome.    Left Upper Extremity: Examination of the left shoulder and arm showed no bony abnormality or edema.  The patient has normal active and passive motion with abduction, flexion, internal rotation, and external rotation.  The patient has no  tenderness with motion.  The patient has a negative Hawkins test and a negative impingement test.  The patient has a negative drop arm test.  The patient is non-tender along the deltoid muscle.  There is no subacromial space tenderness with no AC joint tenderness.  The patient has no instability of the shoulder with anterior-posterior motion.  There is a negative sulcus sign.  The rotator cuff muscle strength is 5/5 with supraspinatus, 5/5 with internal rotation, and 5/5 with external rotation.  There is no crepitus with range of motion activities.      Neurological: He is alert.  Skin: Skin is warm and dry.  Psychiatric: He has a normal mood and affect.  Nursing note and vitals reviewed.    ED Treatments / Results  Labs (all labs ordered are listed, but only abnormal results are displayed) Labs Reviewed - No data to display  EKG  EKG Interpretation None       Radiology No results found.  Procedures Procedures (including critical care time)  Medications Ordered in ED Medications  ketorolac (TORADOL) 30 MG/ML injection  30 mg (not administered)     Initial Impression / Assessment and Plan / ED Course  I have reviewed the triage vital signs and the nursing notes.  Pertinent labs & imaging results that were available during my care of the patient were reviewed by me and considered in my medical decision making (see chart for details).  Clinical Course    60 year old male with 4 days of left-sided C7 radicular symptoms. No weakness or neurological deficits on exam. He is given a prescription for tramadol 50 mg 1 tab by mouth every 6 hours as needed for pain. Etodolac 500 mg 1 tab by mouth twice a day for 10 days. He will follow-up with orthopedics if no improvement in 5-7 days, return to the ER for any worsening symptoms urgent changes in his health.  Final Clinical Impressions(s) / ED Diagnoses   Final diagnoses:  Left cervical radiculopathy    New Prescriptions New  Prescriptions   ETODOLAC (LODINE) 500 MG TABLET    Take 1 tablet (500 mg total) by mouth 2 (two) times daily.   TRAMADOL (ULTRAM) 50 MG TABLET    Take 1 tablet (50 mg total) by mouth every 6 (six) hours as needed.     Evon Slack, PA-C 11/30/15 1721    Phineas Semen, MD 11/30/15 [phone]

## 2015-11-30 NOTE — Discharge Instructions (Signed)
Please take medications as prescribed. Do not take ibuprofen with etodolac. Take etodolac with food. Follow-up with orthopedics if no improvement in 5-7 days. Return to the ER for any worsening symptoms or urgent changes in health.

## 2015-11-30 NOTE — ED Triage Notes (Signed)
Pt with left shoulder pain started last week. Pt moves heavy equipment. Full ROM noted.

## 2016-02-23 LAB — HEMOGLOBIN A1C: HEMOGLOBIN A1C: 7.5

## 2016-04-19 DIAGNOSIS — I10 Essential (primary) hypertension: Secondary | ICD-10-CM | POA: Diagnosis not present

## 2016-04-19 DIAGNOSIS — E784 Other hyperlipidemia: Secondary | ICD-10-CM | POA: Diagnosis not present

## 2016-04-19 DIAGNOSIS — E119 Type 2 diabetes mellitus without complications: Secondary | ICD-10-CM | POA: Diagnosis not present

## 2016-04-26 DIAGNOSIS — M1A00X0 Idiopathic chronic gout, unspecified site, without tophus (tophi): Secondary | ICD-10-CM | POA: Diagnosis not present

## 2016-04-26 DIAGNOSIS — Z79899 Other long term (current) drug therapy: Secondary | ICD-10-CM | POA: Diagnosis not present

## 2016-04-26 DIAGNOSIS — E119 Type 2 diabetes mellitus without complications: Secondary | ICD-10-CM | POA: Diagnosis not present

## 2016-04-27 DIAGNOSIS — Z872 Personal history of diseases of the skin and subcutaneous tissue: Secondary | ICD-10-CM | POA: Diagnosis not present

## 2016-04-27 DIAGNOSIS — M1A00X0 Idiopathic chronic gout, unspecified site, without tophus (tophi): Secondary | ICD-10-CM | POA: Diagnosis not present

## 2016-04-27 DIAGNOSIS — E119 Type 2 diabetes mellitus without complications: Secondary | ICD-10-CM | POA: Diagnosis not present

## 2016-04-27 DIAGNOSIS — L219 Seborrheic dermatitis, unspecified: Secondary | ICD-10-CM | POA: Diagnosis not present

## 2016-04-27 DIAGNOSIS — Z79899 Other long term (current) drug therapy: Secondary | ICD-10-CM | POA: Diagnosis not present

## 2016-06-14 ENCOUNTER — Ambulatory Visit (INDEPENDENT_AMBULATORY_CARE_PROVIDER_SITE_OTHER): Payer: 59 | Admitting: Physician Assistant

## 2016-06-14 ENCOUNTER — Encounter: Payer: Self-pay | Admitting: Physician Assistant

## 2016-06-14 VITALS — BP 126/82 | HR 76 | Temp 98.9°F | Resp 16 | Wt 269.0 lb

## 2016-06-14 DIAGNOSIS — B9789 Other viral agents as the cause of diseases classified elsewhere: Secondary | ICD-10-CM | POA: Diagnosis not present

## 2016-06-14 DIAGNOSIS — J029 Acute pharyngitis, unspecified: Secondary | ICD-10-CM

## 2016-06-14 DIAGNOSIS — J069 Acute upper respiratory infection, unspecified: Secondary | ICD-10-CM

## 2016-06-14 NOTE — Patient Instructions (Signed)

## 2016-06-14 NOTE — Progress Notes (Signed)
[facility]  Chief Complaint  Patient presents with  . URI    Started yesterday    Subjective:    Patient ID: Sean Roy, male    DOB: 12/12/1955, 61 y.o.   MRN: [id number]  Upper Respiratory Infection: Sean Roy is a 61 y.o. male with a past medical history significant for Diabetes, Hypertension complaining of symptoms of a URI. Symptoms include congestion, cough and sore throat. Onset of symptoms was 1 day ago, unchanged since that time. He also c/o congestion, cough described as productive, lightheadedness and nasal congestion for the past 1 day .  He is not drinking much. Evaluation to date: none. Treatment to date: cough suppressants. The treatment has provided no relief.   Review of Systems  Constitutional: Positive for fatigue. Negative for activity change, appetite change, chills, diaphoresis, fever and unexpected weight change.  HENT: Positive for congestion, postnasal drip, sore throat, trouble swallowing and voice change. Negative for ear discharge, ear pain, nosebleeds, rhinorrhea, sinus pain, sinus pressure and tinnitus.   Eyes: Negative.   Respiratory: Positive for cough. Negative for apnea, choking, chest tightness, shortness of breath, wheezing and stridor.   Gastrointestinal: Negative.   Neurological: Positive for headaches. Negative for dizziness and light-headedness.       Objective:   BP 126/82 (BP Location: Left Arm, Patient Position: Sitting, Cuff Size: Large)   Pulse 76   Temp 98.9 F (37.2 C) (Oral)   Resp 16   Wt 269 lb (122 kg)   BMI 37.52 kg/m    Patient Active Problem List   Diagnosis Date Noted  . Absolute anemia 01/13/2015  . Body mass index (BMI) of 29.0-29.9 in adult 01/13/2015  . Elevated CK 01/13/2015  . Gastric ulcer 01/13/2015  . HLD (hyperlipidemia) 01/13/2015  . Cannot sleep 01/13/2015  . Back strain 01/13/2015  . Arthritis, degenerative 01/13/2015  . Essential (primary) hypertension  12/21/2014  . 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 Type 2 diabetes mellitus (HCC) 12/29/2013    Outpatient Encounter Prescriptions as of 06/14/2016  Medication Sig Note  . atorvastatin (LIPITOR) 40 MG tablet Take 1 tablet (40 mg total) by mouth daily.   . Dulaglutide (TRULICITY) 1.5 MG/0.5ML SOPN Inject into the skin. 05/04/2015: Received from: [facility]  . etodolac (LODINE) 500 MG tablet Take 1 tablet (500 mg total) by mouth 2 (two) times daily.   Marland Kitchen gabapentin (NEURONTIN) 300 MG capsule Take 300 mg by mouth at bedtime.   Marland Kitchen glucose blood (ONE TOUCH ULTRA TEST) test strip Use 1 strip  once a day  [DX 250.00] 01/13/2015: Received from: [facility]  . glyBURIDE-metformin (GLUCOVANCE) 2.5-500 MG tablet Take 2 tablets by mouth 2 (two) times daily with a meal.   . hydrocortisone 2.5 % cream Apply 1 application topically.   Marland Kitchen ketoconazole (NIZORAL) 2 % cream Apply 1 application topically daily.   Marland Kitchen lisinopril (PRINIVIL,ZESTRIL) 40 MG tablet Take 1 tablet (40 mg total) by mouth daily.   . traMADol (ULTRAM) 50 MG tablet Take 1 tablet (50 mg total) by mouth every 6 (six) hours as needed.    No facility-administered encounter medications on file as of 06/14/2016.     No Known Allergies     Physical Exam  Constitutional: He appears well-developed and well-nourished.  HENT:  Mouth/Throat: Posterior oropharyngeal edema and posterior oropharyngeal erythema present. No oropharyngeal exudate.  Cardiovascular: Normal rate, regular rhythm and normal heart sounds.   Pulmonary/Chest: Effort normal and breath  sounds normal. No respiratory distress. He has no wheezes.  Lymphadenopathy:    He has cervical adenopathy.  Skin: Skin is warm and dry.  Psychiatric: He has a normal mood and affect. His behavior is normal.       Assessment & Plan:   1. Viral URI with cough  Nontoxic, afebrile today in office, no respiratory distress. Rapid flu  swab was negative. Advised symptomatic treatment with pain relief, Tylenol. Can gargle with liquid Benadryl to help throat.Call clinic if worsening.   2. Pharyngitis, unspecified etiology  See above.  Recommend rest, fluids, frequent hand washing.  Return if symptoms worsen or fail to improve.   Patient Instructions  Pharyngitis Pharyngitis is a sore throat (pharynx). There is redness, pain, and swelling of your throat. Follow these instructions at home:  Drink enough fluids to keep your pee (urine) clear or pale yellow.  Only take medicine as told by your doctor.  You may get sick again if you do not take medicine as told. Finish your medicines, even if you start to feel better.  Do not take aspirin.  Rest.  Rinse your mouth (gargle) with salt water ( tsp of salt per 1 qt of water) every 1-2 hours. This will help the pain.  If you are not at risk for choking, you can suck on hard candy or sore throat lozenges. Contact a doctor if:  You have large, tender lumps on your neck.  You have a rash.  You cough up green, yellow-brown, or bloody spit. Get help right away if:  You have a stiff neck.  You drool or cannot swallow liquids.  You throw up (vomit) or are not able to keep medicine or liquids down.  You have very bad pain that does not go away with medicine.  You have problems breathing (not from a stuffy nose). This information is not intended to replace advice given to you by your health care provider. Make sure you discuss any questions you have with your health care provider. Document Released: 08/23/2007 Document Revised: 08/12/2015 Document Reviewed: 11/11/2012 Elsevier Interactive Patient Education  2017 [employer]     The entirety of the information documented in the History of Present Illness, Review of Systems and Physical Exam were personally obtained by me. Portions of this information were initially documented by Kavin LeechLaura Walsh, CMA and reviewed by  me for thoroughness and accuracy.

## 2016-06-23 DIAGNOSIS — M109 Gout, unspecified: Secondary | ICD-10-CM | POA: Diagnosis not present

## 2016-06-23 DIAGNOSIS — M79676 Pain in unspecified toe(s): Secondary | ICD-10-CM | POA: Diagnosis not present

## 2016-07-24 ENCOUNTER — Encounter: Payer: 59 | Admitting: Family Medicine

## 2016-07-25 DIAGNOSIS — Z79899 Other long term (current) drug therapy: Secondary | ICD-10-CM | POA: Diagnosis not present

## 2016-07-25 DIAGNOSIS — M1A00X0 Idiopathic chronic gout, unspecified site, without tophus (tophi): Secondary | ICD-10-CM | POA: Diagnosis not present

## 2016-07-25 DIAGNOSIS — I10 Essential (primary) hypertension: Secondary | ICD-10-CM | POA: Diagnosis not present

## 2016-11-09 DIAGNOSIS — E113293 Type 2 diabetes mellitus with mild nonproliferative diabetic retinopathy without macular edema, bilateral: Secondary | ICD-10-CM | POA: Diagnosis not present

## 2016-11-29 DIAGNOSIS — I10 Essential (primary) hypertension: Secondary | ICD-10-CM | POA: Diagnosis not present

## 2016-11-29 DIAGNOSIS — M1A00X0 Idiopathic chronic gout, unspecified site, without tophus (tophi): Secondary | ICD-10-CM | POA: Diagnosis not present

## 2016-11-29 DIAGNOSIS — E784 Other hyperlipidemia: Secondary | ICD-10-CM | POA: Diagnosis not present

## 2017-01-05 DIAGNOSIS — M1A00X0 Idiopathic chronic gout, unspecified site, without tophus (tophi): Secondary | ICD-10-CM | POA: Diagnosis not present

## 2017-01-05 DIAGNOSIS — Z79899 Other long term (current) drug therapy: Secondary | ICD-10-CM | POA: Diagnosis not present

## 2017-03-12 ENCOUNTER — Encounter: Payer: Self-pay | Admitting: Family Medicine

## 2017-03-12 ENCOUNTER — Ambulatory Visit: Payer: 59 | Admitting: Family Medicine

## 2017-03-12 VITALS — BP 150/110 | HR 85 | Temp 99.0°F | Resp 15 | Wt 269.2 lb

## 2017-03-12 DIAGNOSIS — R361 Hematospermia: Secondary | ICD-10-CM

## 2017-03-12 LAB — POCT URINALYSIS DIPSTICK
BILIRUBIN UA: NEGATIVE
Glucose, UA: NEGATIVE
KETONES UA: NEGATIVE
Leukocytes, UA: NEGATIVE
NITRITE UA: NEGATIVE
Spec Grav, UA: 1.015 (ref 1.010–1.025)
UROBILINOGEN UA: 0.2 U/dL
pH, UA: 5 (ref 5.0–8.0)

## 2017-03-12 NOTE — Patient Instructions (Addendum)
We will call you with the lab result. If recurrence of your symptoms or labs is abnormal we will sent you to the urologist.

## 2017-03-12 NOTE — Progress Notes (Signed)
Subjective:     Patient ID: Sean Roy, male   DOB: [birth year], 61 y.o.   MRN: [id number] Chief Complaint  Patient presents with  . Advice Only   HPI States he noticed bright red blood in his sperm about 5 days ago. States this was painless and denies dysuria. Nocturia x 0. Married and monogamous. Reports similar occurrence last year which was treated with abx by his cardiologist.  Review of Systems     Objective:   Physical Exam  Constitutional: He appears well-developed and well-nourished. No distress.  Psychiatric:  anxious       Assessment:    1. Hematospermia - POCT urinalysis dipstick - PSA    Plan:    Further f/u pending lab work. Consider urology referral.

## 2017-03-14 ENCOUNTER — Other Ambulatory Visit: Payer: Self-pay | Admitting: Family Medicine

## 2017-03-14 DIAGNOSIS — R361 Hematospermia: Secondary | ICD-10-CM | POA: Diagnosis not present

## 2017-03-15 ENCOUNTER — Telehealth: Payer: Self-pay | Admitting: Emergency Medicine

## 2017-03-15 LAB — PSA: Prostate Specific Ag, Serum: 0.6 ng/mL (ref 0.0–4.0)

## 2017-03-15 NOTE — Telephone Encounter (Signed)
He reports that he has not checked to see if he has had any more blood but says he will call back and get referral if he notices it again.

## 2017-03-15 NOTE — Telephone Encounter (Signed)
-----   Message from Anola Gurneyobert Chauvin, [place] sent at 03/15/2017  7:33 AM EST ----- Prostate blood test is normal. Would he like a referral to urology? If he has not had any further episodes of blood in his semen can choose to monitor.

## 2017-03-15 NOTE — Telephone Encounter (Signed)
LMTCB

## 2017-04-10 DIAGNOSIS — H43813 Vitreous degeneration, bilateral: Secondary | ICD-10-CM | POA: Diagnosis not present

## 2017-04-19 DIAGNOSIS — N400 Enlarged prostate without lower urinary tract symptoms: Secondary | ICD-10-CM | POA: Diagnosis not present

## 2017-04-19 DIAGNOSIS — E7849 Other hyperlipidemia: Secondary | ICD-10-CM | POA: Diagnosis not present

## 2017-04-19 DIAGNOSIS — I10 Essential (primary) hypertension: Secondary | ICD-10-CM | POA: Diagnosis not present

## 2017-06-26 DIAGNOSIS — B351 Tinea unguium: Secondary | ICD-10-CM | POA: Diagnosis not present

## 2017-06-26 DIAGNOSIS — E1142 Type 2 diabetes mellitus with diabetic polyneuropathy: Secondary | ICD-10-CM | POA: Diagnosis not present

## 2017-10-08 ENCOUNTER — Encounter: Payer: Self-pay | Admitting: Family Medicine

## 2017-10-08 DIAGNOSIS — E119 Type 2 diabetes mellitus without complications: Secondary | ICD-10-CM | POA: Diagnosis not present

## 2017-10-08 DIAGNOSIS — H43813 Vitreous degeneration, bilateral: Secondary | ICD-10-CM | POA: Diagnosis not present

## 2017-10-08 LAB — HM DIABETES EYE EXAM

## 2017-10-23 DIAGNOSIS — L219 Seborrheic dermatitis, unspecified: Secondary | ICD-10-CM | POA: Diagnosis not present

## 2017-10-23 DIAGNOSIS — L816 Other disorders of diminished melanin formation: Secondary | ICD-10-CM | POA: Diagnosis not present

## 2017-12-02 ENCOUNTER — Encounter: Payer: Self-pay | Admitting: Emergency Medicine

## 2017-12-02 ENCOUNTER — Other Ambulatory Visit: Payer: Self-pay

## 2017-12-02 DIAGNOSIS — Y998 Other external cause status: Secondary | ICD-10-CM | POA: Diagnosis not present

## 2017-12-02 DIAGNOSIS — Z79899 Other long term (current) drug therapy: Secondary | ICD-10-CM | POA: Insufficient documentation

## 2017-12-02 DIAGNOSIS — S6992XA Unspecified injury of left wrist, hand and finger(s), initial encounter: Secondary | ICD-10-CM | POA: Diagnosis not present

## 2017-12-02 DIAGNOSIS — S60212A Contusion of left wrist, initial encounter: Secondary | ICD-10-CM | POA: Insufficient documentation

## 2017-12-02 DIAGNOSIS — Y92410 Unspecified street and highway as the place of occurrence of the external cause: Secondary | ICD-10-CM | POA: Diagnosis not present

## 2017-12-02 DIAGNOSIS — E785 Hyperlipidemia, unspecified: Secondary | ICD-10-CM | POA: Diagnosis not present

## 2017-12-02 DIAGNOSIS — S63502A Unspecified sprain of left wrist, initial encounter: Secondary | ICD-10-CM | POA: Diagnosis not present

## 2017-12-02 DIAGNOSIS — Y939 Activity, unspecified: Secondary | ICD-10-CM | POA: Insufficient documentation

## 2017-12-02 DIAGNOSIS — I10 Essential (primary) hypertension: Secondary | ICD-10-CM | POA: Diagnosis not present

## 2017-12-02 DIAGNOSIS — E119 Type 2 diabetes mellitus without complications: Secondary | ICD-10-CM | POA: Diagnosis not present

## 2017-12-02 DIAGNOSIS — S60812A Abrasion of left wrist, initial encounter: Secondary | ICD-10-CM | POA: Diagnosis not present

## 2017-12-02 DIAGNOSIS — M25532 Pain in left wrist: Secondary | ICD-10-CM | POA: Diagnosis not present
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
## 2017-12-03 ENCOUNTER — Emergency Department: Payer: 59

## 2017-12-03 ENCOUNTER — Emergency Department
Admission: EM | Admit: 2017-12-03 | Discharge: 2017-12-03 | Disposition: A | Discharge status: Home / Self Care | Payer: 59 | Attending: Emergency Medicine | Admitting: Emergency Medicine

## 2017-12-03 DIAGNOSIS — S63502A Unspecified sprain of left wrist, initial encounter: Secondary | ICD-10-CM

## 2017-12-03 DIAGNOSIS — S60212A Contusion of left wrist, initial encounter: Secondary | ICD-10-CM

## 2017-12-03 DIAGNOSIS — S6992XA Unspecified injury of left wrist, hand and finger(s), initial encounter: Secondary | ICD-10-CM | POA: Diagnosis not present

## 2017-12-03 DIAGNOSIS — M25532 Pain in left wrist: Secondary | ICD-10-CM | POA: Diagnosis not present

## 2017-12-03 DIAGNOSIS — T148XXA Other injury of unspecified body region, initial encounter: Secondary | ICD-10-CM

## 2017-12-03 MED ORDER — IBUPROFEN 800 MG PO TABS
800.0000 mg | ORAL_TABLET | Freq: Once | ORAL | Status: AC
  Administered 2017-12-03: 800 mg via ORAL
  Filled 2017-12-03: qty 1

## 2017-12-03 MED ORDER — HYDROCODONE-ACETAMINOPHEN 5-325 MG PO TABS: 1.0000 | ORAL_TABLET | Freq: Four times a day (QID) | ORAL | 0 refills | Status: DC | PRN

## 2017-12-03 MED ORDER — BACITRACIN ZINC 500 UNIT/GM EX OINT: TOPICAL_OINTMENT | Freq: Once | CUTANEOUS | Status: DC

## 2017-12-03 MED ORDER — BACITRACIN-NEOMYCIN-POLYMYXIN 400-5-5000 EX OINT
TOPICAL_OINTMENT | Freq: Once | CUTANEOUS | Status: AC
  Administered 2017-12-03: 1 via TOPICAL

## 2017-12-03 MED ORDER — IBUPROFEN 800 MG PO TABS: 800.0000 mg | ORAL_TABLET | Freq: Three times a day (TID) | ORAL | 0 refills | Status: DC | PRN

## 2017-12-03 MED ORDER — BACITRACIN-NEOMYCIN-POLYMYXIN 400-5-5000 EX OINT
TOPICAL_OINTMENT | CUTANEOUS | Status: AC
  Administered 2017-12-03: 1 via TOPICAL
  Filled 2017-12-03: qty 1
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
## 2017-12-03 NOTE — ED Notes (Signed)
Difficult at times to get pt to focus and answer questions as he's been playing games or watching videos on his cell phone during the entire triage process

## 2017-12-03 NOTE — Discharge Instructions (Signed)
1.  You may take pain medicines as needed (Motrin/Norco #15). 2.  You may remove Velcro wrist splint to bathe and sleep. 3.  Elevated affected area and apply ice over splint to reduce swelling. 4.  Return to the ER for worsening symptoms, persistent vomiting, difficulty breathing or other concerns.
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
## 2018-01-04 ENCOUNTER — Encounter: Payer: Self-pay | Admitting: Family Medicine

## 2018-01-04 ENCOUNTER — Ambulatory Visit: Payer: 59 | Admitting: Family Medicine

## 2018-01-04 VITALS — BP 162/96 | HR 83 | Temp 98.4°F | Ht 71.0 in | Wt 257.6 lb

## 2018-01-04 DIAGNOSIS — Z23 Encounter for immunization: Secondary | ICD-10-CM | POA: Diagnosis not present

## 2018-01-04 DIAGNOSIS — M62838 Other muscle spasm: Secondary | ICD-10-CM

## 2018-01-04 DIAGNOSIS — I10 Essential (primary) hypertension: Secondary | ICD-10-CM | POA: Diagnosis not present

## 2018-01-04 DIAGNOSIS — E1165 Type 2 diabetes mellitus with hyperglycemia: Secondary | ICD-10-CM

## 2018-01-04 DIAGNOSIS — M1A00X0 Idiopathic chronic gout, unspecified site, without tophus (tophi): Secondary | ICD-10-CM

## 2018-01-04 DIAGNOSIS — Z1159 Encounter for screening for other viral diseases: Secondary | ICD-10-CM

## 2018-01-04 DIAGNOSIS — Z114 Encounter for screening for human immunodeficiency virus [HIV]: Secondary | ICD-10-CM

## 2018-01-04 DIAGNOSIS — E7800 Pure hypercholesterolemia, unspecified: Secondary | ICD-10-CM | POA: Diagnosis not present

## 2018-01-04 MED ORDER — CYCLOBENZAPRINE HCL 5 MG PO TABS: 5.0000 mg | ORAL_TABLET | Freq: Three times a day (TID) | ORAL | 0 refills | Status: DC | PRN

## 2018-01-04 NOTE — Progress Notes (Signed)
Patient: Sean Roy Male    DOB: 09-30-1955   62 y.o.   MRN: [id number] Visit Date: 01/04/2018  Today's Provider: Shirlee Latch, MD   Chief Complaint  Patient presents with  . Diabetes  . Hypertension  . Hyperlipidemia   Subjective:    I, Sean Roy, CMA, am acting as a Neurosurgeon for Shirlee Latch, MD.   HPI  Diabetes Mellitus Type II, Follow-up:   Lab Results  Component Value Date   HGBA1C 7.5 02/23/2016   HGBA1C 11.0 (H) 01/21/2015   Last seen for diabetes more than 1 years ago.  Management since then includes no changes. He reports poor compliance with treatment. Patient states he has not been taking medication for a while.  He is not having side effects.  Current symptoms include none  Home blood sugar records: fasting range: 120's  Episodes of hypoglycemia? no   Current Insulin Regimen: Trulicity Most Recent Eye Exam: UTD per patient- Goes to [facility] Weight trend: stable Current diet: in general, a "healthy" diet   Current exercise: none  ------------------------------------------------------------------------   Hypertension, follow-up:  BP Readings from Last 3 Encounters:  01/04/18 (!) 162/96  12/03/17 135/79  03/12/17 (!) 150/110    He was last seen for hypertension more 1 years ago.  Management since that visit includes no changes.He reports good compliance with treatment.  But refill was in 2017, so it is clear he is not taking regularly He is not having side effects.  He is not exercising. He is adherent to low salt diet.   Outside blood pressures are not being checked at home. He is experiencing shoulder pain.  Patient denies chest pain, chest pressure/discomfort, claudication, dyspnea, exertional chest pressure/discomfort, fatigue, irregular heart beat, lower extremity edema, near-syncope, orthopnea, palpitations, paroxysmal nocturnal dyspnea, syncope and tachypnea.   Cardiovascular risk factors include advanced age  (older than 68 for men, 8 for women), diabetes mellitus, dyslipidemia, hypertension, male gender and obesity (BMI >= 30 kg/m2).  Use of agents associated with hypertension: none.   ------------------------------------------------------------------------    Lipid/Cholesterol, Follow-up:   Last seen for this more than 1 years ago.  Management since that visit includes no changes.  Last Lipid Panel:    Component Value Date/Time   CHOL 129 05/05/2015 0825   TRIG 77 05/05/2015 0825   HDL 36 (L) 05/05/2015 0825   CHOLHDL 3.6 05/05/2015 0825   LDLCALC 78 05/05/2015 0825    He reports good compliance with treatment.  Has been out of this for close to 1 year He is not having side effects.   Wt Readings from Last 3 Encounters:  01/04/18 257 lb 9.6 oz (116.8 kg)  12/02/17 260 lb (117.9 kg)  03/12/17 269 lb 3.2 oz (122.1 kg)    ------------------------------------------------------------------------ Patient is also concerned about a pain beneath his left shoulder blade.  He states he had an episode of this last year as well.  Is been present this time for about 2 weeks.  He was moving heavy boxes prior to it starting and now he noticed that it is worse if he is lifting anything.  He sometimes gets some soreness down his whole left arm.  Nothing seems to make it better.  He has not tried any medications, ice, heat, etc.  No numbness, tingling, weakness, chest pain, shortness of breath   No Known Allergies   Current Outpatient Medications:  .  Dulaglutide (TRULICITY) 1.5 MG/0.5ML SOPN, Inject into the skin., Disp: , Rfl:  .  allopurinol (ZYLOPRIM) 300 MG tablet, TAKE 2 TABLETS BY MOUTH ONCE DAILY, Disp: , Rfl:  .  atorvastatin (LIPITOR) 40 MG tablet, Take 1 tablet (40 mg total) by mouth daily. (Patient not taking: Reported on 01/04/2018), Disp: 90 tablet, Rfl: 1 .  colchicine 0.6 MG tablet, Take 2 tablets (1.2mg ) by mouth at first sign of gout flare followed by 1 tablet (0.6mg ) after 1  hour. (Max 1.8mg  within 1 hour), Disp: , Rfl:  .  glucose blood (ONE TOUCH ULTRA TEST) test strip, Use 1 strip  once a day  [DX 250.00], Disp: , Rfl:  .  glyBURIDE-metformin (GLUCOVANCE) 2.5-500 MG tablet, TAKE 2 TABLETS BY MOUTH TWO TIMES DAILY WITH MEALS, Disp: , Rfl:  .  HYDROcodone-acetaminophen (NORCO) 5-325 MG tablet, Take 1 tablet by mouth every 6 (six) hours as needed for moderate pain. (Patient not taking: Reported on 01/04/2018), Disp: 15 tablet, Rfl: 0 .  ibuprofen (ADVIL,MOTRIN) 800 MG tablet, Take 1 tablet (800 mg total) by mouth every 8 (eight) hours as needed for moderate pain. (Patient not taking: Reported on 01/04/2018), Disp: 15 tablet, Rfl: 0 .  indomethacin (INDOCIN) 50 MG capsule, TAKE ONE CAPSULE BY MOUTH 3 TIMES A DAY AS NEEDED, Disp: , Rfl:  .  lisinopril (PRINIVIL,ZESTRIL) 20 MG tablet, Take by mouth., Disp: , Rfl:  .  sildenafil (REVATIO) 20 MG tablet, Take by mouth., Disp: , Rfl:   Review of Systems  Constitutional: Negative.   HENT: Negative.   Respiratory: Negative.   Cardiovascular: Negative.   Gastrointestinal: Negative.   Genitourinary: Negative.   Musculoskeletal: Positive for back pain and myalgias. Negative for arthralgias, gait problem and joint swelling.  Skin: Negative.   Neurological: Negative.   Psychiatric/Behavioral: Negative.     Social History   Tobacco Use  . Smoking status: Never Smoker  . Smokeless tobacco: Never Used  Substance Use Topics  . Alcohol use: Yes    Alcohol/week: 3.0 standard drinks    Types: 3 Shots of liquor per week    Comment: occasional   Objective:   BP (!) 162/96 (BP Location: Right Arm, Patient Position: Sitting, Cuff Size: Large)   Pulse 83   Temp 98.4 F (36.9 C) (Oral)   Ht 5\' 11"  (1.803 m)   Wt 257 lb 9.6 oz (116.8 kg)   SpO2 97%   BMI 35.93 kg/m  Vitals:   01/04/18 1547  BP: (!) 162/96  Pulse: 83  Temp: 98.4 F (36.9 C)  TempSrc: Oral  SpO2: 97%  Weight: 257 lb 9.6 oz (116.8 kg)  Height: 5'  11" (1.803 m)     Physical Exam  Constitutional: He is oriented to person, place, and time. He appears well-developed and well-nourished. No distress.  HENT:  Head: Normocephalic and atraumatic.  Nose: Nose normal.  Mouth/Throat: Oropharynx is clear and moist.  Eyes: Conjunctivae are normal. No scleral icterus.  Neck: Neck supple. No thyromegaly present.  Cardiovascular: Normal rate, regular rhythm, normal heart sounds and intact distal pulses.  No murmur heard. Pulmonary/Chest: Effort normal and breath sounds normal. No respiratory distress. He has no wheezes. He has no rales.  Abdominal: Soft. He exhibits no distension. There is no tenderness. There is no guarding.  Musculoskeletal: He exhibits no edema or deformity.  No TTP along shoulder/scapula/spine bony landmarks or surrounding musculature.  Does have tightness of Traps on L side and states this si where pain comes from.  Strength and sensation in bilateral upper extremities is normal and symmetric.  Lymphadenopathy:  He has no cervical adenopathy.  Neurological: He is alert and oriented to person, place, and time.  Skin: Skin is warm and dry. Capillary refill takes less than 2 seconds. No rash noted.  Psychiatric: He has a normal mood and affect. His behavior is normal.  Vitals reviewed.       Assessment & Plan:   Problem List Items Addressed This Visit      Cardiovascular and Mediastinum   Essential (primary) hypertension    Uncontrolled Has been out of medications for about 1 year It is listed in his chart that he was previously taking lisinopril 20 mg daily, but the bottle he brings today's for lisinopril-HCTZ combo pill He needs to be back on some medication, but would like to see what his creatinine is prior to starting 1 of these medications I would also likely avoid any thiazide diuretics given his chronic gout He will get metabolic panel today and we will call to start any antihypertensive      Relevant  Medications   lisinopril (PRINIVIL,ZESTRIL) 20 MG tablet   sildenafil (REVATIO) 20 MG tablet   lisinopril-hydrochlorothiazide (PRINZIDE,ZESTORETIC) 20-12.5 MG tablet   Other Relevant Orders   Comprehensive metabolic panel     Endocrine   Type 2 diabetes mellitus (HCC) - Primary    States he is currently taking his Trulicity weekly, but he is not taking his glyburide and metformin Likely uncontrolled as he is off of several medicines Recheck A1c Continue Trulicity Pending A1c, consider resuming metformin or glyburide or both Would also consider an SGLT2 in the future      Relevant Medications   glyBURIDE-metformin (GLUCOVANCE) 2.5-500 MG tablet   lisinopril (PRINIVIL,ZESTRIL) 20 MG tablet   lisinopril-hydrochlorothiazide (PRINZIDE,ZESTORETIC) 20-12.5 MG tablet   Other Relevant Orders   Hemoglobin A1c     Musculoskeletal and Integument   Chronic gouty arthropathy without tophi    Per report, well controlled and doing well on allopurinol Continue current allopurinol dose Has colchicine and indomethacin on hand for any flares Recheck uric acid level to ensure at goal      Relevant Orders   Uric acid   Trapezius muscle spasm    New problem Exam is reassuring and there is no bony tenderness or signs of radiculopathy Suspect that his spasm of his trapezius muscle is was contributing to his pain Can continue ibuprofen as needed and try Flexeril as well        Other   Morbid obesity (HCC)    Discussed importance of healthy weight, especially given comorbidities including diabetes, hypertension, gout, hyperlipidemia Discussed diet and exercise      Relevant Medications   glyBURIDE-metformin (GLUCOVANCE) 2.5-500 MG tablet   Other Relevant Orders   Comprehensive metabolic panel   HLD (hyperlipidemia)    Not currently taking a statin Recheck lipid panel Given that he has diabetes, he would likely benefit from some statin, which will be resumed/started pending lipid panel  results      Relevant Medications   lisinopril (PRINIVIL,ZESTRIL) 20 MG tablet   sildenafil (REVATIO) 20 MG tablet   lisinopril-hydrochlorothiazide (PRINZIDE,ZESTORETIC) 20-12.5 MG tablet   Other Relevant Orders   Lipid panel   Comprehensive metabolic panel    Other Visit Diagnoses    Need for influenza vaccination       Screening for HIV (human immunodeficiency virus)       Relevant Orders   HIV antibody (with reflex)   Need for hepatitis C screening test  Relevant Orders   Hepatitis C Antibody       Return in about 6 weeks (around 02/15/2018) for CPE.   The entirety of the information documented in the History of Present Illness, Review of Systems and Physical Exam were personally obtained by me. Portions of this information were initially documented by Sean Roy, CMA and reviewed by me for thoroughness and accuracy.    Erasmo Downer, MD, MPH [facility] 01/05/2018 9:54 AM

## 2018-01-04 NOTE — Patient Instructions (Signed)

## 2018-01-05 DIAGNOSIS — M62838 Other muscle spasm: Secondary | ICD-10-CM | POA: Insufficient documentation

## 2018-01-05 NOTE — Assessment & Plan Note (Signed)
Not currently taking a statin Recheck lipid panel Given that he has diabetes, he would likely benefit from some statin, which will be resumed/started pending lipid panel results

## 2018-01-05 NOTE — Assessment & Plan Note (Addendum)
Uncontrolled Has been out of medications for about 1 year It is listed in his chart that he was previously taking lisinopril 20 mg daily, but the bottle he brings today's for lisinopril-HCTZ combo pill He needs to be back on some medication, but would like to see what his creatinine is prior to starting 1 of these medications I would also likely avoid any thiazide diuretics given his chronic gout He will get metabolic panel today and we will call to start any antihypertensive

## 2018-01-05 NOTE — Assessment & Plan Note (Signed)
Per report, well controlled and doing well on allopurinol Continue current allopurinol dose Has colchicine and indomethacin on hand for any flares Recheck uric acid level to ensure at goal

## 2018-01-05 NOTE — Assessment & Plan Note (Signed)
New problem Exam is reassuring and there is no bony tenderness or signs of radiculopathy Suspect that his spasm of his trapezius muscle is was contributing to his pain Can continue ibuprofen as needed and try Flexeril as well

## 2018-01-05 NOTE — Assessment & Plan Note (Signed)
States he is currently taking his Trulicity weekly, but he is not taking his glyburide and metformin Likely uncontrolled as he is off of several medicines Recheck A1c Continue Trulicity Pending A1c, consider resuming metformin or glyburide or both Would also consider an SGLT2 in the future

## 2018-01-05 NOTE — Assessment & Plan Note (Signed)
Discussed importance of healthy weight, especially given comorbidities including diabetes, hypertension, gout, hyperlipidemia Discussed diet and exercise

## 2018-01-09 DIAGNOSIS — E1165 Type 2 diabetes mellitus with hyperglycemia: Secondary | ICD-10-CM | POA: Diagnosis not present

## 2018-01-09 DIAGNOSIS — Z1159 Encounter for screening for other viral diseases: Secondary | ICD-10-CM | POA: Diagnosis not present

## 2018-01-09 DIAGNOSIS — I10 Essential (primary) hypertension: Secondary | ICD-10-CM | POA: Diagnosis not present

## 2018-01-09 DIAGNOSIS — E7800 Pure hypercholesterolemia, unspecified: Secondary | ICD-10-CM | POA: Diagnosis not present

## 2018-01-10 LAB — LIPID PANEL
CHOL/HDL RATIO: 5.8 ratio — AB (ref 0.0–5.0)
Cholesterol, Total: 277 mg/dL — ABNORMAL HIGH (ref 100–199)
HDL: 48 mg/dL (ref >39)
LDL Calculated: 202 mg/dL — ABNORMAL HIGH (ref 0–99)
TRIGLYCERIDES: 133 mg/dL (ref 0–149)
VLDL CHOLESTEROL CAL: 27 mg/dL (ref 5–40)

## 2018-01-10 LAB — COMPREHENSIVE METABOLIC PANEL
A/G RATIO: 1.4 (ref 1.2–2.2)
ALK PHOS: 72 IU/L (ref 39–117)
ALT: 40 IU/L (ref 0–44)
AST: 24 IU/L (ref 0–40)
Albumin: 4.3 g/dL (ref 3.6–4.8)
BILIRUBIN TOTAL: 0.3 mg/dL (ref 0.0–1.2)
BUN / CREAT RATIO: 18 (ref 10–24)
BUN: 22 mg/dL (ref 8–27)
CHLORIDE: 99 mmol/L (ref 96–106)
CO2: 25 mmol/L (ref 20–29)
Calcium: 9.9 mg/dL (ref 8.6–10.2)
Creatinine, Ser: 1.21 mg/dL (ref 0.76–1.27)
GFR calc non Af Amer: 65 mL/min/{1.73_m2} (ref >59)
GFR, EST AFRICAN AMERICAN: 75 mL/min/{1.73_m2} (ref >59)
Globulin, Total: 3.1 g/dL (ref 1.5–4.5)
Glucose: 215 mg/dL — ABNORMAL HIGH (ref 65–99)
POTASSIUM: 4.6 mmol/L (ref 3.5–5.2)
Sodium: 139 mmol/L (ref 134–144)
TOTAL PROTEIN: 7.4 g/dL (ref 6.0–8.5)

## 2018-01-10 LAB — HEPATITIS C ANTIBODY: Hep C Virus Ab: <0.1 s/co ratio (ref 0.0–0.9)

## 2018-01-10 LAB — HEMOGLOBIN A1C
Est. average glucose Bld gHb Est-mCnc: 243 mg/dL
HEMOGLOBIN A1C: 10.1 % — AB (ref 4.8–5.6)

## 2018-01-10 LAB — HIV ANTIBODY (ROUTINE TESTING W REFLEX): HIV Screen 4th Generation wRfx: NONREACTIVE

## 2018-01-10 LAB — URIC ACID: URIC ACID: 8.6 mg/dL (ref 3.7–8.6)

## 2018-01-11 ENCOUNTER — Telehealth: Payer: Self-pay

## 2018-01-11 DIAGNOSIS — E785 Hyperlipidemia, unspecified: Secondary | ICD-10-CM

## 2018-01-11 MED ORDER — GLYBURIDE-METFORMIN 2.5-500 MG PO TABS
ORAL_TABLET | ORAL | 3 refills | Status: DC
Start: 2018-01-11 — End: 2018-05-06

## 2018-01-11 MED ORDER — ALLOPURINOL 300 MG PO TABS: 600.0000 mg | ORAL_TABLET | Freq: Every day | ORAL | 3 refills | Status: DC

## 2018-01-11 MED ORDER — COLCHICINE 0.6 MG PO TABS: 0.6000 mg | ORAL_TABLET | Freq: Two times a day (BID) | ORAL | 5 refills | Status: DC

## 2018-01-11 MED ORDER — LISINOPRIL 20 MG PO TABS: 20.0000 mg | ORAL_TABLET | Freq: Every day | ORAL | 1 refills | Status: DC

## 2018-01-11 MED ORDER — ATORVASTATIN CALCIUM 40 MG PO TABS: 40.0000 mg | ORAL_TABLET | Freq: Every day | ORAL | 1 refills | Status: DC

## 2018-01-11 NOTE — Telephone Encounter (Signed)
Patient advised.

## 2018-01-11 NOTE — Telephone Encounter (Signed)
Refill sent to pharmacy.  He will need to take colchicine twice daily in addition allopurinol while we are building this up to appropriate level.  Erasmo Downer, MD, MPH [facility] 01/11/2018 2:57 PM

## 2018-01-11 NOTE — Telephone Encounter (Signed)
-----   Message from Sean Downer, MD sent at 01/10/2018  4:27 PM EDT ----- Diabetes uncontrolled with hemoglobin A1c of 10.1.  This is not surprising as he has been off of his medications.  He can resume his glyburide/metformin and should continue his Trulicity.  We can send refills of any of the medicines we are resuming that he needs.  Cholesterol is very elevated.  Should resume atorvastatin 40 mg daily as he was taking previously.  Stable kidney function.  Normal liver function, electrolytes.  Can resume his lisinopril 20 mg daily for his blood pressure if he has not already.  Would avoid HCTZ if possible given his gout.  His uric acid level is high at 8.6.  This is probably from stopping the allopurinol.  If he has not had any gout flares in the last year since he stopped his medication, it may be worth just staying off of.  If we were to resume it, we would have to recheck labs every month and keep him on colchicine until he was well controlled because starting allopurinol without colchicine can cause increase in gout flares.  Negative HIV and hepatitis C screening  Bacigalupo, Marzella Schlein, MD, MPH [facility] 01/10/2018 4:27 PM

## 2018-01-11 NOTE — Telephone Encounter (Signed)
Patient advised. He states he need refills on all medications listed to restart. He reports having a gout flare yesterday. He is scheduled on 12/6 for a follow up appointment so uric acid levels can be rechecked at that time.

## 2018-01-14 ENCOUNTER — Telehealth: Payer: Self-pay | Admitting: Family Medicine

## 2018-01-14 NOTE — Telephone Encounter (Signed)
Pt needing to talk with nurse regarding his gout medication.  Please advise.  Thanks, [employer]

## 2018-01-15 NOTE — Telephone Encounter (Signed)
Advised patient that Dr B sent in 2 medications for gout. One is for daily and one as needed.

## 2018-02-25 ENCOUNTER — Ambulatory Visit (INDEPENDENT_AMBULATORY_CARE_PROVIDER_SITE_OTHER): Payer: 59 | Admitting: Family Medicine

## 2018-02-25 ENCOUNTER — Encounter: Payer: Self-pay | Admitting: Family Medicine

## 2018-02-25 VITALS — BP 162/102 | HR 76 | Temp 98.2°F | Ht 71.0 in | Wt 265.2 lb

## 2018-02-25 DIAGNOSIS — Z0000 Encounter for general adult medical examination without abnormal findings: Secondary | ICD-10-CM

## 2018-02-25 DIAGNOSIS — E1169 Type 2 diabetes mellitus with other specified complication: Secondary | ICD-10-CM

## 2018-02-25 DIAGNOSIS — E1165 Type 2 diabetes mellitus with hyperglycemia: Secondary | ICD-10-CM | POA: Diagnosis not present

## 2018-02-25 DIAGNOSIS — E7849 Other hyperlipidemia: Secondary | ICD-10-CM

## 2018-02-25 DIAGNOSIS — Z23 Encounter for immunization: Secondary | ICD-10-CM | POA: Diagnosis not present

## 2018-02-25 DIAGNOSIS — M1A00X0 Idiopathic chronic gout, unspecified site, without tophus (tophi): Secondary | ICD-10-CM

## 2018-02-25 DIAGNOSIS — I10 Essential (primary) hypertension: Secondary | ICD-10-CM | POA: Diagnosis not present

## 2018-02-25 DIAGNOSIS — E785 Hyperlipidemia, unspecified: Secondary | ICD-10-CM

## 2018-02-25 MED ORDER — DULAGLUTIDE 1.5 MG/0.5ML ~~LOC~~ SOAJ: 1.5000 mg | SUBCUTANEOUS | 3 refills | Status: DC

## 2018-02-25 MED ORDER — LISINOPRIL 40 MG PO TABS: 40.0000 mg | ORAL_TABLET | Freq: Every day | ORAL | 3 refills | Status: DC

## 2018-02-25 MED ORDER — ATORVASTATIN CALCIUM 40 MG PO TABS: 40.0000 mg | ORAL_TABLET | Freq: Every day | ORAL | 3 refills | Status: DC

## 2018-02-25 NOTE — Assessment & Plan Note (Signed)
Previously uncontrolled Now taking statin with good compliance Plan to recheck FLP at next visit and consider dose titrations

## 2018-02-25 NOTE — Assessment & Plan Note (Signed)
Well controlled Tolerating allopurinol well  Continue this and colchicine Recheck uric acid at next visit

## 2018-02-25 NOTE — Assessment & Plan Note (Signed)
As above, recheck lipid panel at next visit Continue statin

## 2018-02-25 NOTE — Assessment & Plan Note (Signed)
Discussed importance of healthy weight management, especially with comorbidities Discussed diet and exercise

## 2018-02-25 NOTE — Patient Instructions (Signed)

## 2018-02-25 NOTE — Progress Notes (Addendum)
Patient: Sean Roy, Male    DOB: 10/09/1955, 62 y.o.   MRN: [id number] Visit Date: 02/25/2018  Today's Provider: Shirlee Latch, MD   Chief Complaint  Patient presents with  . Annual Exam   Subjective:  I, [name], CMA, am acting as a scribe for Shirlee Latch, MD.   Annual physical exam Shaine Banka is a 62 y.o. male who presents today for health maintenance and complete physical. He feels well. He reports exercising occasionally. He reports he is sleeping well.  HTN: - Medications: lisinopril 20mg  daily - Compliance: good - Checking BP at home: no - Denies any SOB, CP, vision changes, LE edema, medication SEs, or symptoms of hypotension - Diet: low sodium - Exercise: none formal, active job  T2DM - Checking BG at home: no - wants to buy a needleless meter - Medications: metformin, glyburide, trulicity - Compliance: good - Diet: low carb - eye exam: UTD - foot exam: UTD - microalbumin: on ACEi - denies symptoms of hypoglycemia, polyuria, polydipsia, numbness extremities, foot ulcers/trauma  HLD - medications: atorvastatin 40mg  daily - compliance: good  - medication SEs: none, denies myalgias   Gout: States that he is taking allopurinol and colchicine with good compliance.  He denies recent flares. -----------------------------------------------------------------   Review of Systems  Constitutional: Negative.   HENT: Negative.   Eyes: Negative.   Respiratory: Negative.   Cardiovascular: Negative.   Gastrointestinal: Negative.   Endocrine: Negative.   Genitourinary: Negative.   Musculoskeletal: Positive for arthralgias.  Skin: Negative.   Allergic/Immunologic: Negative.   Neurological: Negative.   Hematological: Negative.   Psychiatric/Behavioral: Negative.     Social History      He  reports that he has never smoked. He has never used smokeless tobacco. He reports that he drinks about 3.0 standard drinks of alcohol per week. He  reports that he does not use drugs.       Social History   Socioeconomic History  . Marital status: Married    Spouse name: Not on file  . Number of children: Not on file  . Years of education: Not on file  . Highest education level: Not on file  Occupational History  . Not on file  Social Needs  . Financial resource strain: Not on file  . Food insecurity:    Worry: Not on file    Inability: Not on file  . Transportation needs:    Medical: Not on file    Non-medical: Not on file  Tobacco Use  . Smoking status: Never Smoker  . Smokeless tobacco: Never Used  Substance and Sexual Activity  . Alcohol use: Yes    Alcohol/week: 3.0 standard drinks    Types: 3 Shots of liquor per week    Comment: occasional  . Drug use: No  . Sexual activity: Not on file  Lifestyle  . Physical activity:    Days per week: Not on file    Minutes per session: Not on file  . Stress: Not on file  Relationships  . Social connections:    Talks on phone: Not on file    Gets together: Not on file    Attends religious service: Not on file    Active member of club or organization: Not on file    Attends meetings of clubs or organizations: Not on file    Relationship status: Not on file  Other Topics Concern  . Not on file  Social History Narrative  .  Not on file    Past Medical History:  Diagnosis Date  . Diabetes mellitus without complication (HCC)   . Hyperlipidemia   . Hypertension      Patient Active Problem List   Diagnosis Date Noted  . Trapezius muscle spasm 01/05/2018  . Chronic gouty arthropathy without tophi 07/27/2015  . Absolute anemia 01/13/2015  . Morbid obesity (HCC) 01/13/2015  . Elevated CK 01/13/2015  . Gastric ulcer 01/13/2015  . HLD (hyperlipidemia) 01/13/2015  . Cannot sleep 01/13/2015  . Back strain 01/13/2015  . Arthritis, degenerative 01/13/2015  . Essential (primary) hypertension 12/21/2014  . 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  Type 2 diabetes mellitus (HCC) 12/29/2013    Past Surgical History:  Procedure Laterality Date  . NO PAST SURGERIES      Family History        Family Status  Relation Name Status  . Mother  Deceased at age 60's       Cause of death unknown  . Father  Alive  . Sister  Alive  . Brother  Alive  . Brother  Alive  . Sister  Alive        His family history includes Healthy in his brother, brother, sister, and sister.      No Known Allergies   Current Outpatient Medications:  .  allopurinol (ZYLOPRIM) 300 MG tablet, Take 2 tablets (600 mg total) by mouth daily., Disp: 60 tablet, Rfl: 3 .  atorvastatin (LIPITOR) 40 MG tablet, Take 1 tablet (40 mg total) by mouth daily., Disp: 90 tablet, Rfl: 3 .  colchicine 0.6 MG tablet, Take 1 tablet (0.6 mg total) by mouth 2 (two) times daily., Disp: 60 tablet, Rfl: 5 .  Dulaglutide (TRULICITY) 1.5 MG/0.5ML SOPN, Inject 1.5 mg into the skin once a week., Disp: 12 pen, Rfl: 3 .  glucose blood (ONE TOUCH ULTRA TEST) test strip, Use 1 strip  once a day  [DX 250.00], Disp: , Rfl:  .  glyBURIDE-metformin (GLUCOVANCE) 2.5-500 MG tablet, TAKE 2 TABLETS BY MOUTH TWO TIMES DAILY WITH MEALS, Disp: 120 tablet, Rfl: 3 .  indomethacin (INDOCIN) 50 MG capsule, TAKE ONE CAPSULE BY MOUTH 3 TIMES A DAY AS NEEDED, Disp: , Rfl:  .  lisinopril (PRINIVIL,ZESTRIL) 40 MG tablet, Take 1 tablet (40 mg total) by mouth daily., Disp: 90 tablet, Rfl: 3 .  sildenafil (REVATIO) 20 MG tablet, Take by mouth., Disp: , Rfl:    Patient Care Team: Erasmo DownerBacigalupo, Tessie Ordaz M, MD as PCP - General (Family Medicine)      Objective:   Vitals: BP (!) 162/102 (BP Location: Right Arm, Patient Position: Sitting, Cuff Size: Large)   Pulse 76   Temp 98.2 F (36.8 C) (Oral)   Ht 5\' 11"  (1.803 m)   Wt 265 lb 3.2 oz (120.3 kg)   SpO2 95%   BMI 36.99 kg/m    Vitals:   02/25/18 1100  BP: (!) 162/102  Pulse: 76  Temp: 98.2 F (36.8 C)  TempSrc: Oral  SpO2: 95%  Weight: 265 lb 3.2 oz  (120.3 kg)  Height: 5\' 11"  (1.803 m)     Physical Exam  Constitutional: He is oriented to person, place, and time. He appears well-developed and well-nourished. No distress.  HENT:  Head: Normocephalic and atraumatic.  Right Ear: External ear normal.  Left Ear: External ear normal.  Nose: Nose normal.  Mouth/Throat: Oropharynx is clear and moist.  Eyes: Pupils are equal, round, and reactive to light. Conjunctivae and EOM  are normal. No scleral icterus.  Neck: Neck supple. No thyromegaly present.  Cardiovascular: Normal rate, regular rhythm, normal heart sounds and intact distal pulses.  No murmur heard. Pulmonary/Chest: Effort normal and breath sounds normal. No respiratory distress. He has no wheezes. He has no rales.  Abdominal: Soft. Bowel sounds are normal. He exhibits no distension. There is no tenderness. There is no rebound and no guarding.  Musculoskeletal: He exhibits no edema or deformity.  Lymphadenopathy:    He has no cervical adenopathy.  Neurological: He is alert and oriented to person, place, and time.  Skin: Skin is warm and dry. Capillary refill takes less than 2 seconds. No rash noted.  Psychiatric: He has a normal mood and affect. His behavior is normal.  Vitals reviewed.    Depression Screen PHQ 2/9 Scores 02/25/2018 07/19/2015  PHQ - 2 Score 0 0  PHQ- 9 Score 0 -     Assessment & Plan:     Routine Health Maintenance and Physical Exam  Exercise Activities and Dietary recommendations Goals   None     Immunization History  Administered Date(s) Administered  . Influenza Split 03/10/2013  . Influenza,inj,Quad PF,6+ Mos 01/05/2014, 01/04/2018  . Pneumococcal Polysaccharide-23 05/04/2015  . Td 04/05/2004  . Tdap 02/25/2018    Health Maintenance  Topic Date Due  . HEMOGLOBIN A1C  07/11/2018  . OPHTHALMOLOGY EXAM  10/09/2018  . FOOT EXAM  01/05/2019  . COLONOSCOPY  02/15/2020  . TETANUS/TDAP  02/26/2028  . INFLUENZA VACCINE  Completed  .  PNEUMOCOCCAL POLYSACCHARIDE VACCINE AGE 37-64 HIGH RISK  Completed  . Hepatitis C Screening  Completed  . HIV Screening  Completed     Discussed health benefits of physical activity, and encouraged him to engage in regular exercise appropriate for his age and condition.    --------------------------------------------------------------------  Problem List Items Addressed This Visit      Cardiovascular and Mediastinum   Essential (primary) hypertension    Uncontrolled Reports good compliance with medicaiton now which was an issue in the past Discussed DASH diet Increase lisinopril to 40mg  daily Reviewed recent labs F/u in ~6-8 weeks and recheck BMP and BP Avoiding HCTZ due to chronic gout      Relevant Medications   lisinopril (PRINIVIL,ZESTRIL) 40 MG tablet   atorvastatin (LIPITOR) 40 MG tablet     Endocrine   Type 2 diabetes mellitus (HCC)    Uncontrolled Not checking BGs - advised on importance Continue current meds F/u in ~6 weeks and recheck A1c      Relevant Medications   lisinopril (PRINIVIL,ZESTRIL) 40 MG tablet   Dulaglutide (TRULICITY) 1.5 MG/0.5ML SOPN   atorvastatin (LIPITOR) 40 MG tablet     Musculoskeletal and Integument   Chronic gouty arthropathy without tophi    Well controlled Tolerating allopurinol well  Continue this and colchicine Recheck uric acid at next visit        Other   Morbid obesity (HCC)    Discussed importance of healthy weight management, especially with comorbidities Discussed diet and exercise      Relevant Medications   Dulaglutide (TRULICITY) 1.5 MG/0.5ML SOPN   HLD (hyperlipidemia)    Previously uncontrolled Now taking statin with good compliance Plan to recheck FLP at next visit and consider dose titrations      Relevant Medications   lisinopril (PRINIVIL,ZESTRIL) 40 MG tablet   atorvastatin (LIPITOR) 40 MG tablet   Familial multiple lipoprotein-type hyperlipidemia    As above, recheck lipid panel at next  visit Continue  statin      Relevant Medications   lisinopril (PRINIVIL,ZESTRIL) 40 MG tablet   atorvastatin (LIPITOR) 40 MG tablet    Other Visit Diagnoses    Encounter for annual physical exam    -  Primary       Return in about 2 months (around 04/28/2018) for f/u on DM, HLD, HTN, gout (after 1/23).   The entirety of the information documented in the History of Present Illness, Review of Systems and Physical Exam were personally obtained by me. Portions of this information were initially documented by [name], CMA and reviewed by me for thoroughness and accuracy.    Erasmo Downer, MD, MPH [facility] 02/25/2018 12:51 PM

## 2018-02-25 NOTE — Assessment & Plan Note (Signed)
Uncontrolled Reports good compliance with medicaiton now which was an issue in the past Discussed DASH diet Increase lisinopril to 40mg  daily Reviewed recent labs F/u in ~6-8 weeks and recheck BMP and BP Avoiding HCTZ due to chronic gout

## 2018-02-25 NOTE — Assessment & Plan Note (Signed)
Uncontrolled Not checking BGs - advised on importance Continue current meds F/u in ~6 weeks and recheck A1c

## 2018-04-17 ENCOUNTER — Ambulatory Visit: Payer: 59 | Admitting: Family Medicine

## 2018-04-23 ENCOUNTER — Other Ambulatory Visit: Payer: Self-pay | Admitting: Family Medicine

## 2018-04-23 ENCOUNTER — Encounter: Payer: Self-pay | Admitting: Family Medicine

## 2018-04-23 ENCOUNTER — Telehealth: Payer: Self-pay | Admitting: Family Medicine

## 2018-04-23 ENCOUNTER — Other Ambulatory Visit: Payer: Self-pay

## 2018-04-23 ENCOUNTER — Ambulatory Visit: Payer: 59 | Admitting: Family Medicine

## 2018-04-23 VITALS — BP 140/100 | HR 83 | Temp 98.4°F | Ht 71.0 in | Wt 268.2 lb

## 2018-04-23 DIAGNOSIS — G5711 Meralgia paresthetica, right lower limb: Secondary | ICD-10-CM

## 2018-04-23 DIAGNOSIS — M25561 Pain in right knee: Secondary | ICD-10-CM | POA: Diagnosis not present

## 2018-04-23 DIAGNOSIS — G8929 Other chronic pain: Secondary | ICD-10-CM

## 2018-04-23 MED ORDER — PREDNISONE 10 MG PO TABS: ORAL_TABLET | ORAL | 0 refills | Status: DC

## 2018-04-23 NOTE — Progress Notes (Signed)
  Subjective:     Patient ID: Sean Roy, male   DOB: [birth year], 63 y.o.   MRN: [id number] Chief Complaint  Patient presents with  . Knee Pain    right knee and feels numbness above knee since 04/20/18   HPI Wishes referral to orthopedics for chronic right knee pain. Has received injections in the past at [facility] clinic. Wears knee pads and installs floors. Does not wear a work belt.  Review of Systems     Objective:   Physical Exam Constitutional:      General: He is not in acute distress.    Appearance: He is not ill-appearing.  Musculoskeletal:     Comments: Right LE strength 5/5. Right knee with FROM. No specific areas of tenderness or swelling.Localizes area of numbness to his rigtht antero-lateral thigh. He does have a pannus and is wearing a tight belt today.  Neurological:     Mental Status: He is alert.        Assessment:    1. Chronic pain of right knee:referral to orthopedics  2. Meralgia paresthetica of right side: prednisone taper    Plan:    He is to quit wearing a tight belt. Consider neuro referral if not improving.

## 2018-04-23 NOTE — Patient Instructions (Signed)
Try to limit use of your belt and not put pressure in your abdominal area. You will get a all about the orthopedic referral.

## 2018-04-23 NOTE — Telephone Encounter (Signed)
[facility] [address] [facility]  He came back in the office to change it.   Please [facility] meds [facility].

## 2018-04-23 NOTE — Telephone Encounter (Signed)
done

## 2018-04-29 ENCOUNTER — Ambulatory Visit (INDEPENDENT_AMBULATORY_CARE_PROVIDER_SITE_OTHER): Payer: 59 | Admitting: Family Medicine

## 2018-04-29 ENCOUNTER — Encounter: Payer: Self-pay | Admitting: Family Medicine

## 2018-04-29 VITALS — BP 159/96 | HR 89 | Temp 98.8°F | Wt 267.4 lb

## 2018-04-29 DIAGNOSIS — E1165 Type 2 diabetes mellitus with hyperglycemia: Secondary | ICD-10-CM | POA: Diagnosis not present

## 2018-04-29 DIAGNOSIS — M1A00X0 Idiopathic chronic gout, unspecified site, without tophus (tophi): Secondary | ICD-10-CM

## 2018-04-29 DIAGNOSIS — I10 Essential (primary) hypertension: Secondary | ICD-10-CM | POA: Diagnosis not present

## 2018-04-29 DIAGNOSIS — E7849 Other hyperlipidemia: Secondary | ICD-10-CM

## 2018-04-29 MED ORDER — AMLODIPINE BESYLATE 5 MG PO TABS: 5.0000 mg | ORAL_TABLET | Freq: Every day | ORAL | 3 refills | Status: DC

## 2018-04-29 NOTE — Assessment & Plan Note (Signed)
Unclear if he is taking his atorvastatin or not Medication compliance has been an issue Will need to start statin if not currently taking

## 2018-04-29 NOTE — Patient Instructions (Signed)
Call about medications today so we can make sure you are taking what you are supposed to

## 2018-04-29 NOTE — Assessment & Plan Note (Signed)
It is unclear if patient is taking allopurinol or colchicine He does not believe he is taking both Recheck uric acid Patient to call back to let us know what he is taking

## 2018-04-29 NOTE — Assessment & Plan Note (Signed)
Previously uncontrolled Continue to have doubts about compliance Recheck A1c Changes in medications pending A1c

## 2018-04-29 NOTE — Assessment & Plan Note (Signed)
Discussed diet and exercise 

## 2018-04-29 NOTE — Assessment & Plan Note (Signed)
Uncontrolled Reports good compliance with medication, but I have my doubts and this has been an issue in the past Continue lisinopril 40mg  daily Discussed dash diet Add amlodipine 5mg  daily Avoid thiazides given chronic gout F/u in 3 months Recheck BMP

## 2018-04-29 NOTE — Progress Notes (Signed)
Patient: Sean Roy Male    DOB: May 15, 1955   63 y.o.   MRN: [id number] Visit Date: 04/29/2018  Today's Provider: Shirlee Latch, MD   Chief Complaint  Patient presents with  . Hypertension  . Hyperlipidemia  . Diabetes   Subjective:    I, [name], CMA, am acting as a Neurosurgeon for Shirlee Latch, MD.   HPI  Diabetes Mellitus Type II, Follow-up:   Lab Results  Component Value Date   HGBA1C 10.1 (H) 01/09/2018   HGBA1C 7.5 02/23/2016   HGBA1C 11.0 (H) 01/21/2015   Last seen for diabetes 2 months ago.  Management since then includes continue medications. He reports good compliance with treatment. He is not having side effects.  Current symptoms include none  Home blood sugar records: fasting range: 120's  Episodes of hypoglycemia? no   Current Insulin Regimen: Trulicity Most Recent Eye Exam: 10/08/2017 Weight trend: stable Current diet: well balanced Current exercise: lightly  ------------------------------------------------------------------------   Hypertension, follow-up:  BP Readings from Last 3 Encounters:  04/29/18 (!) 159/96  04/23/18 (!) 140/100  02/25/18 (!) 162/102    He was last seen for hypertension 2 months ago.  BP at that visit was 162/102. Management since that visit includes advised to increase Lisinopril to 40 mg daily and follow DASH diet.He reports good compliance with treatment. He is not having side effects.  He is exercising. He is adherent to low salt diet.   Outside blood pressures are not being checked at home. He is experiencing none.  Patient denies chest pain, chest pressure/discomfort, claudication, dyspnea, exertional chest pressure/discomfort, fatigue, irregular heart beat, lower extremity edema, near-syncope, orthopnea, palpitations, paroxysmal nocturnal dyspnea, syncope and tachypnea.   Cardiovascular risk factors include advanced age (older than 20 for men, 34 for women), diabetes mellitus, dyslipidemia,  hypertension, male gender and obesity (BMI >= 30 kg/m2).  Use of agents associated with hypertension: none.   ------------------------------------------------------------------------    Lipid/Cholesterol, Follow-up:   Last seen for this 2 months ago.  Management since that visit includes continue medication  Last Lipid Panel:    Component Value Date/Time   CHOL 277 (H) 01/09/2018 1006   TRIG 133 01/09/2018 1006   HDL 48 01/09/2018 1006   CHOLHDL 5.8 (H) 01/09/2018 1006   LDLCALC 202 (H) 01/09/2018 1006    He is unsure whether he is taking atorvastatin. He is not having side effects.   Wt Readings from Last 3 Encounters:  04/29/18 267 lb 6.4 oz (121.3 kg)  04/23/18 268 lb 3.2 oz (121.7 kg)  02/25/18 265 lb 3.2 oz (120.3 kg)    ------------------------------------------------------------------------  No Known Allergies   Current Outpatient Medications:  .  allopurinol (ZYLOPRIM) 300 MG tablet, Take 2 tablets (600 mg total) by mouth daily., Disp: 60 tablet, Rfl: 3 .  atorvastatin (LIPITOR) 40 MG tablet, Take 1 tablet (40 mg total) by mouth daily., Disp: 90 tablet, Rfl: 3 .  colchicine 0.6 MG tablet, Take 1 tablet (0.6 mg total) by mouth 2 (two) times daily., Disp: 60 tablet, Rfl: 5 .  Dulaglutide (TRULICITY) 1.5 MG/0.5ML SOPN, Inject 1.5 mg into the skin once a week., Disp: 12 pen, Rfl: 3 .  glucose blood (ONE TOUCH ULTRA TEST) test strip, Use 1 strip  once a day  [DX 250.00], Disp: , Rfl:  .  glyBURIDE-metformin (GLUCOVANCE) 2.5-500 MG tablet, TAKE 2 TABLETS BY MOUTH TWO TIMES DAILY WITH MEALS, Disp: 120 tablet, Rfl: 3 .  indomethacin (INDOCIN) 50 MG capsule, TAKE  ONE CAPSULE BY MOUTH 3 TIMES A DAY AS NEEDED, Disp: , Rfl:  .  lisinopril (PRINIVIL,ZESTRIL) 40 MG tablet, Take 1 tablet (40 mg total) by mouth daily., Disp: 90 tablet, Rfl: 3 .  sildenafil (REVATIO) 20 MG tablet, Take by mouth., Disp: , Rfl:  .  amLODipine (NORVASC) 5 MG tablet, Take 1 tablet (5 mg total) by  mouth daily., Disp: 30 tablet, Rfl: 3  Review of Systems  Constitutional: Negative.   Respiratory: Negative.   Cardiovascular: Negative.   Endocrine: Negative.   Musculoskeletal: Negative.     Social History   Tobacco Use  . Smoking status: Never Smoker  . Smokeless tobacco: Never Used  Substance Use Topics  . Alcohol use: Yes    Alcohol/week: 3.0 standard drinks    Types: 3 Shots of liquor per week    Comment: occasional      Objective:   BP (!) 159/96 (BP Location: Right Arm, Patient Position: Sitting, Cuff Size: Large)   Pulse 89   Temp 98.8 F (37.1 C) (Oral)   Wt 267 lb 6.4 oz (121.3 kg)   SpO2 99%   BMI 37.29 kg/m  Vitals:   04/29/18 1000  BP: (!) 159/96  Pulse: 89  Temp: 98.8 F (37.1 C)  TempSrc: Oral  SpO2: 99%  Weight: 267 lb 6.4 oz (121.3 kg)     Physical Exam Vitals signs reviewed.  Constitutional:      General: He is not in acute distress.    Appearance: Normal appearance.  HENT:     Head: Normocephalic and atraumatic.     Mouth/Throat:     Pharynx: Oropharynx is clear.  Eyes:     General: No scleral icterus.    Conjunctiva/sclera: Conjunctivae normal.  Neck:     Musculoskeletal: Neck supple.  Cardiovascular:     Rate and Rhythm: Normal rate and regular rhythm.     Pulses: Normal pulses.     Heart sounds: Normal heart sounds. No murmur.  Pulmonary:     Effort: Pulmonary effort is normal. No respiratory distress.     Breath sounds: Normal breath sounds. No wheezing or rhonchi.  Musculoskeletal:     Right lower leg: No edema.     Left lower leg: No edema.  Lymphadenopathy:     Cervical: No cervical adenopathy.  Skin:    General: Skin is warm and dry.     Capillary Refill: Capillary refill takes less than 2 seconds.     Findings: No rash.  Neurological:     Mental Status: He is alert and oriented to person, place, and time. Mental status is at baseline.  Psychiatric:        Mood and Affect: Mood normal.        Behavior: Behavior  normal.         Assessment & Plan   Problem List Items Addressed This Visit      Cardiovascular and Mediastinum   Essential (primary) hypertension    Uncontrolled Reports good compliance with medication, but I have my doubts and this has been an issue in the past Continue lisinopril 40mg  daily Discussed dash diet Add amlodipine 5mg  daily Avoid thiazides given chronic gout F/u in 3 months Recheck BMP      Relevant Medications   amLODipine (NORVASC) 5 MG tablet   Other Relevant Orders   Basic Metabolic Panel (BMET)     Endocrine   Type 2 diabetes mellitus (HCC) - Primary    Previously uncontrolled Continue to have  doubts about compliance Recheck A1c Changes in medications pending A1c      Relevant Orders   Hemoglobin A1c     Musculoskeletal and Integument   Chronic gouty arthropathy without tophi    It is unclear if patient is taking allopurinol or colchicine He does not believe he is taking both Recheck uric acid Patient to call back to let us know what he is taking      Relevant Orders   Uric acid     Other   Morbid obesity (HCC)    Discussed diet and exercise      Familial multiple lipoprotein-type hyperlipidemia    Unclear if he is taking his atorvastatin or not Medication compliance has been an issue Will need to start statin if not currently taking      Relevant Medications   amLODipine (NORVASC) 5 MG tablet       Return in about 3 months (around 07/28/2018) for chronic disease f/u.   The entirety of the information documented in the History of Present Illness, Review of Systems and Physical Exam were personally obtained by me. Portions of this information were initially documented by Presley RaddleNikki Walston, CMA and reviewed by me for thoroughness and accuracy.    Erasmo DownerBacigalupo, Dartanyon Frankowski M, MD, MPH [facility] 04/29/2018 11:44 AM

## 2018-04-30 LAB — BASIC METABOLIC PANEL
BUN/Creatinine Ratio: 18 (ref 10–24)
BUN: 22 mg/dL (ref 8–27)
CALCIUM: 9.5 mg/dL (ref 8.6–10.2)
CHLORIDE: 102 mmol/L (ref 96–106)
CO2: 22 mmol/L (ref 20–29)
Creatinine, Ser: 1.25 mg/dL (ref 0.76–1.27)
GFR calc non Af Amer: 62 mL/min/{1.73_m2} (ref >59)
GFR, EST AFRICAN AMERICAN: 72 mL/min/{1.73_m2} (ref >59)
Glucose: 208 mg/dL — ABNORMAL HIGH (ref 65–99)
Potassium: 5.1 mmol/L (ref 3.5–5.2)
Sodium: 140 mmol/L (ref 134–144)

## 2018-04-30 LAB — HEMOGLOBIN A1C
ESTIMATED AVERAGE GLUCOSE: 203 mg/dL
Hgb A1c MFr Bld: 8.7 % — ABNORMAL HIGH (ref 4.8–5.6)

## 2018-04-30 LAB — URIC ACID: Uric Acid: 7.6 mg/dL (ref 3.7–8.6)

## 2018-05-02 ENCOUNTER — Telehealth: Payer: Self-pay

## 2018-05-02 DIAGNOSIS — E1165 Type 2 diabetes mellitus with hyperglycemia: Secondary | ICD-10-CM

## 2018-05-02 NOTE — Telephone Encounter (Signed)
LMTCB

## 2018-05-02 NOTE — Telephone Encounter (Signed)
-----   Message from Erasmo Downer, MD sent at 05/02/2018  9:23 AM EST ----- A1c is improving, but not to goal.  Need to add another diabetes medication.  Would like to start Jardiance 10mg  daily if ok with patient.  This would be in addition to current medications.  Stable kidney function, electrolytes.  Uric acid level remains high.  Is he taking 2 of the allopurinol 300mg  pills daily as prescribed?

## 2018-05-06 ENCOUNTER — Other Ambulatory Visit: Payer: Self-pay | Admitting: Family Medicine

## 2018-05-06 MED ORDER — EMPAGLIFLOZIN 10 MG PO TABS: 10.0000 mg | ORAL_TABLET | Freq: Every day | ORAL | 3 refills | Status: DC

## 2018-05-06 MED ORDER — ALLOPURINOL 300 MG PO TABS: 600.0000 mg | ORAL_TABLET | Freq: Every day | ORAL | 3 refills | Status: DC

## 2018-05-06 MED ORDER — GLYBURIDE-METFORMIN 2.5-500 MG PO TABS: ORAL_TABLET | ORAL | 3 refills | Status: DC

## 2018-05-06 NOTE — Telephone Encounter (Signed)
OK. Start Jardiance 10mg  daily - Rx sent with refills.  Increase to 2 tabs of Allopurinol daily.  Call back when able with list of medications.

## 2018-05-06 NOTE — Telephone Encounter (Signed)
Patient advised of results. Patient is in agreement with adding Jardiance. I pulled this medication in the "orders" section. What quantity and # of refills would you like to give? Patient would like prescription sent into [facility] [address] location) Also, patient states he is only taking one tablet of Allopurinol 300mg  daily. He mentioned that he was supposed to be calling our office to given the names of the medications he takes.  Patient has been out of town, and he is currently in route going back out of town. He says he wont be back until Thursday or Friday of this week. Patient says he would call back later with the names of his medications once he gets back in town.

## 2018-05-06 NOTE — Telephone Encounter (Signed)
[facility] faxed refill request for the following medications:  glyBURIDE-metformin (GLUCOVANCE) 2.5-500 MG tablet    Please advise.

## 2018-05-06 NOTE — Telephone Encounter (Signed)
Patient advised and verbally voiced understanding.  

## 2018-05-10 ENCOUNTER — Other Ambulatory Visit: Payer: Self-pay

## 2018-05-10 NOTE — Telephone Encounter (Signed)
Pt requesting refill of glyburide and needs [facility] and also to [facility].

## 2018-05-13 MED ORDER — GLYBURIDE-METFORMIN 2.5-500 MG PO TABS: ORAL_TABLET | ORAL | 3 refills | Status: DC

## 2018-05-13 MED ORDER — GLYBURIDE-METFORMIN 2.5-500 MG PO TABS: ORAL_TABLET | ORAL | 3 refills | Status: AC

## 2018-07-10 ENCOUNTER — Other Ambulatory Visit: Payer: Self-pay

## 2018-07-10 ENCOUNTER — Telehealth: Payer: Self-pay

## 2018-07-10 MED ORDER — AMLODIPINE BESYLATE 5 MG PO TABS: 5.0000 mg | ORAL_TABLET | Freq: Every day | ORAL | 3 refills | Status: DC

## 2018-07-10 NOTE — Telephone Encounter (Signed)
Patient states that he is dizzy. Symptoms started yesterday. He wanted to come in the office to see you, but I explained that you are only doing virtual visits at this time. He would like to speak with you.

## 2018-07-10 NOTE — Telephone Encounter (Signed)
So can we schedule an evisit for him?  If he insists on in person visit, could see another provider

## 2018-07-10 NOTE — Telephone Encounter (Signed)
Patietn returned call.

## 2018-07-10 NOTE — Telephone Encounter (Signed)
LMTCB

## 2018-07-10 NOTE — Telephone Encounter (Signed)
Patient is requesting a refill on Amlodipine

## 2018-07-11 ENCOUNTER — Ambulatory Visit: Payer: 59 | Admitting: Family Medicine

## 2018-07-11 ENCOUNTER — Encounter: Payer: Self-pay | Admitting: Family Medicine

## 2018-07-11 ENCOUNTER — Other Ambulatory Visit: Payer: Self-pay

## 2018-07-11 VITALS — BP 147/89 | HR 89 | Temp 98.7°F | Wt 265.6 lb

## 2018-07-11 DIAGNOSIS — R42 Dizziness and giddiness: Secondary | ICD-10-CM

## 2018-07-11 DIAGNOSIS — I10 Essential (primary) hypertension: Secondary | ICD-10-CM | POA: Diagnosis not present

## 2018-07-11 DIAGNOSIS — E1165 Type 2 diabetes mellitus with hyperglycemia: Secondary | ICD-10-CM

## 2018-07-11 MED ORDER — LISINOPRIL 40 MG PO TABS: 40.0000 mg | ORAL_TABLET | Freq: Every day | ORAL | 1 refills | Status: DC

## 2018-07-11 NOTE — Progress Notes (Signed)
Patient: Sean Roy Male    DOB: 1956-03-06   63 y.o.   MRN: [id number] Visit Date: 07/11/2018  Today's Provider: Dortha Kern, PA   Chief Complaint  Patient presents with  . Dizziness   Subjective:     Dizziness  This is a new problem. The current episode started yesterday. The problem occurs intermittently. Progression since onset: resolved today. The symptoms are aggravated by walking. He has tried nothing for the symptoms.   Past Medical History:  Diagnosis Date  . Diabetes mellitus without complication (HCC)   . Hyperlipidemia   . Hypertension    Past Surgical History:  Procedure Laterality Date  . NO PAST SURGERIES     Family History  Problem Relation Age of Onset  . Healthy Sister   . Healthy Brother   . Healthy Brother   . Healthy Sister    No Known Allergies  Current Outpatient Medications:  .  allopurinol (ZYLOPRIM) 300 MG tablet, Take 2 tablets (600 mg total) by mouth daily., Disp: 60 tablet, Rfl: 3 .  amLODipine (NORVASC) 5 MG tablet, Take 1 tablet (5 mg total) by mouth daily., Disp: 30 tablet, Rfl: 3 .  atorvastatin (LIPITOR) 40 MG tablet, Take 1 tablet (40 mg total) by mouth daily., Disp: 90 tablet, Rfl: 3 .  colchicine 0.6 MG tablet, Take 1 tablet (0.6 mg total) by mouth 2 (two) times daily., Disp: 60 tablet, Rfl: 5 .  Dulaglutide (TRULICITY) 1.5 MG/0.5ML SOPN, Inject 1.5 mg into the skin once a week., Disp: 12 pen, Rfl: 3 .  empagliflozin (JARDIANCE) 10 MG TABS tablet, Take 10 mg by mouth daily., Disp: 30 tablet, Rfl: 3 .  glucose blood (ONE TOUCH ULTRA TEST) test strip, Use 1 strip  once a day  [DX 250.00], Disp: , Rfl:  .  glyBURIDE-metformin (GLUCOVANCE) 2.5-500 MG tablet, TAKE 2 TABLETS BY MOUTH TWO TIMES DAILY WITH MEALS, Disp: 360 tablet, Rfl: 3 .  indomethacin (INDOCIN) 50 MG capsule, TAKE ONE CAPSULE BY MOUTH 3 TIMES A DAY AS NEEDED, Disp: , Rfl:  .  lisinopril (PRINIVIL,ZESTRIL) 40 MG tablet, Take 1 tablet (40 mg total) by mouth  daily., Disp: 90 tablet, Rfl: 3  Review of Systems  Constitutional: Negative.   Respiratory: Negative.   Cardiovascular: Negative.   Musculoskeletal: Negative.   Neurological: Positive for dizziness.   Social History   Tobacco Use  . Smoking status: Never Smoker  . Smokeless tobacco: Never Used  Substance Use Topics  . Alcohol use: Yes    Alcohol/week: 3.0 standard drinks    Types: 3 Shots of liquor per week    Comment: occasional     Objective:   BP (!) 147/89 (BP Location: Right Arm, Patient Position: Sitting, Cuff Size: Large)   Pulse 89   Temp 98.7 F (37.1 C) (Oral)   Wt 265 lb 9.6 oz (120.5 kg)   BMI 37.04 kg/m    Wt Readings from Last 3 Encounters:  07/11/18 265 lb 9.6 oz (120.5 kg)  04/29/18 267 lb 6.4 oz (121.3 kg)  04/23/18 268 lb 3.2 oz (121.7 kg)   BP Readings from Last 3 Encounters:  07/11/18 (!) 147/89  04/29/18 (!) 159/96  04/23/18 (!) 140/100   Vitals:   07/11/18 1353  BP: (!) 147/89  Pulse: 89  Temp: 98.7 F (37.1 C)  TempSrc: Oral  Weight: 265 lb 9.6 oz (120.5 kg)   Physical Exam Constitutional:      General: He is not in  acute distress.    Appearance: He is well-developed.  HENT:     Head: Normocephalic and atraumatic.     Right Ear: Hearing and tympanic membrane normal.     Left Ear: Hearing and tympanic membrane normal.     Nose: Nose normal.  Eyes:     General: Lids are normal. No scleral icterus.       Right eye: No discharge.        Left eye: No discharge.     Conjunctiva/sclera: Conjunctivae normal.  Neck:     Musculoskeletal: Neck supple.     Vascular: No carotid bruit.  Cardiovascular:     Rate and Rhythm: Normal rate and regular rhythm.     Heart sounds: Normal heart sounds.  Pulmonary:     Effort: Pulmonary effort is normal. No respiratory distress.  Abdominal:     General: Bowel sounds are normal.     Palpations: Abdomen is soft.  Musculoskeletal: Normal range of motion.  Skin:    Findings: No lesion or rash.   Neurological:     Mental Status: He is alert and oriented to person, place, and time.  Psychiatric:        Speech: Speech normal.        Behavior: Behavior normal.        Thought Content: Thought content normal.       Assessment & Plan    1. Dizziness Short term feeling of light headed sensation yesterday for a short time. Recommend she increase fluid intake and get back on his antihypertensive medications regularly (has been off it for nearly a week). No dizziness today. No true vertigo or nausea. Denies head congestion, sore throat, earache, stopped up ears, cough or dyspnea. Recheck if symptoms return or worsens.  2. Essential (primary) hypertension BP a little better today than reading on 04-29-18. No chest pain, dyspnea or peripheral edema. Continues Lisinopril 40 mg qd and got Amlodipine 5 mg qd refilled yesterday. Remembers eating heavily salted "pig's feet" dish yesterday. Recommend he restrict salt intake and follow up with Dr. Charmayne SheerBacigulopo as planned in May 2020.  3. Type 2 diabetes mellitus with hyperglycemia, without long-term current use of insulin (HCC) Denies hypoglycemic episodes and still taking the Jardiance, Trulicity and Glucovance regularly as prescribed. Last Hgb A1C was 8.7% on 04-29-18 with glucose 208, Cr 1.25 and GFR 72. Continue present regimen and diet, Keep follow up appointment with Dr. Beryle FlockBacigalupo 07-28-18 as planned.     Dortha Kernennis Kalese Ensz, PA  [facility]

## 2018-07-11 NOTE — Telephone Encounter (Signed)
Patient calling back and scheduled an appointment with a different provider. Thanks.

## 2018-07-30 ENCOUNTER — Ambulatory Visit: Payer: 59 | Admitting: Family Medicine

## 2018-09-16 ENCOUNTER — Ambulatory Visit: Payer: Self-pay | Admitting: Family Medicine

## 2018-10-01 NOTE — Progress Notes (Deleted)
Patient: Sean Roy Male    DOB: 1955-11-08   63 y.o.   MRN: [id number] Visit Date: 10/01/2018  Today's Provider: Lavon Paganini, MD   No chief complaint on file.  Subjective:     HPI  Diabetes Mellitus Type II, Follow-up:  Lab Results  Component Value Date   HGBA1C 8.7 (H) 04/29/2018    Last seen for diabetes 2 months ago.  Management since then includes continue medications. He reports good compliance with treatment. He is not having side effects.  Current symptoms include none  Home blood sugar records: fasting range:   Episodes of hypoglycemia? no              Current Insulin Regimen: Trulicity Most Recent Eye Exam: 10/08/2017 Weight trend: stable Current diet: well balanced Current exercise: lightly  ------------------------------------------------------------------------   Hypertension, follow-up:     BP Readings from Last 3 Encounters:  04/29/18 (!) 159/96  04/23/18 (!) 140/100  02/25/18 (!) 162/102    He was last seen for hypertension 2 months ago.  BP at that visit was 159/96. Management since that visit includes continue Lisinopril 40 mg daily and start Amlodipine 5 mg daily. He reports good compliance with treatment. He is not having side effects.  He is exercising. He is adherent to low salt diet.   Outside blood pressures are not being checked at home. He is experiencing none.  Patient denies chest pain, chest pressure/discomfort, claudication, dyspnea, exertional chest pressure/discomfort, fatigue, irregular heart beat, lower extremity edema, near-syncope, orthopnea, palpitations, paroxysmal nocturnal dyspnea, syncope and tachypnea.   Cardiovascular risk factors include advanced age (older than 79 for men, 51 for women), diabetes mellitus, dyslipidemia, hypertension, male gender and obesity (BMI >= 30 kg/m2).  Use of agents associated with hypertension: none.    ------------------------------------------------------------------------    Lipid/Cholesterol, Follow-up:   Last seen for this 2 months ago.  Management since that visit includes continue medication  Last Lipid Panel: Labs (Brief)   Lab Results  Component Value Date   CHOL 277 (H) 01/09/2018   HDL 48 01/09/2018   LDLCALC 202 (H) 01/09/2018   TRIG 133 01/09/2018   CHOLHDL 5.8 (H) 01/09/2018   He is unsure whether he is taking atorvastatin. He is not having side effects.      Wt Readings from Last 3 Encounters:  04/29/18 267 lb 6.4 oz (121.3 kg)  04/23/18 268 lb 3.2 oz (121.7 kg)  02/25/18 265 lb 3.2 oz (120.3 kg)    ------------------------------------------------------------------------   No Known Allergies   Current Outpatient Medications:  .  allopurinol (ZYLOPRIM) 300 MG tablet, Take 2 tablets (600 mg total) by mouth daily., Disp: 60 tablet, Rfl: 3 .  amLODipine (NORVASC) 5 MG tablet, Take 1 tablet (5 mg total) by mouth daily., Disp: 30 tablet, Rfl: 3 .  atorvastatin (LIPITOR) 40 MG tablet, Take 1 tablet (40 mg total) by mouth daily., Disp: 90 tablet, Rfl: 3 .  colchicine 0.6 MG tablet, Take 1 tablet (0.6 mg total) by mouth 2 (two) times daily., Disp: 60 tablet, Rfl: 5 .  Dulaglutide (TRULICITY) 1.5 YQ/6.5HQ SOPN, Inject 1.5 mg into the skin once a week., Disp: 12 pen, Rfl: 3 .  empagliflozin (JARDIANCE) 10 MG TABS tablet, Take 10 mg by mouth daily., Disp: 30 tablet, Rfl: 3 .  glucose blood (ONE TOUCH ULTRA TEST) test strip, Use 1 strip  once a day  [DX 250.00], Disp: , Rfl:  .  glyBURIDE-metformin (GLUCOVANCE) 2.5-500 MG tablet, TAKE  2 TABLETS BY MOUTH TWO TIMES DAILY WITH MEALS, Disp: 360 tablet, Rfl: 3 .  indomethacin (INDOCIN) 50 MG capsule, TAKE ONE CAPSULE BY MOUTH 3 TIMES A DAY AS NEEDED, Disp: , Rfl:  .  lisinopril (ZESTRIL) 40 MG tablet, Take 1 tablet (40 mg total) by mouth daily., Disp: 90 tablet, Rfl: 1  Review of Systems  Constitutional:  Negative.   Respiratory: Negative.   Cardiovascular: Negative.   Musculoskeletal: Negative.     Social History   Tobacco Use  . Smoking status: Never Smoker  . Smokeless tobacco: Never Used  Substance Use Topics  . Alcohol use: Yes    Alcohol/week: 3.0 standard drinks    Types: 3 Shots of liquor per week    Comment: occasional      Objective:   There were no vitals taken for this visit. There were no vitals filed for this visit.   Physical Exam   No results found for any visits on 10/02/18.     Assessment & Plan        Shirlee LatchAngela Bacigalupo, MD  [facility]

## 2018-10-02 ENCOUNTER — Ambulatory Visit: Payer: Self-pay | Admitting: Family Medicine

## 2019-01-01 ENCOUNTER — Ambulatory Visit (INDEPENDENT_AMBULATORY_CARE_PROVIDER_SITE_OTHER): Payer: 59

## 2019-01-01 ENCOUNTER — Other Ambulatory Visit: Payer: Self-pay

## 2019-01-01 DIAGNOSIS — Z23 Encounter for immunization: Secondary | ICD-10-CM | POA: Diagnosis not present

## 2019-03-25 ENCOUNTER — Other Ambulatory Visit: Payer: Self-pay | Admitting: Family Medicine

## 2019-03-26 NOTE — Telephone Encounter (Signed)
Requested medication (s) are due for refill today: yes  Requested medication (s) are on the active medication list: yes  Last refill:  11/04/2018  Future visit scheduled: no  Notes to clinic: no valid encounter within last 6 months Review for refill   Requested Prescriptions  Pending Prescriptions Disp Refills   TRULICITY 1.5 MG/0.5ML SOPN [Pharmacy Med Name: TRULICITY  1.5MG  0.5ML SOLUTION PEN-INJECTOR  PEN] 6 mL 3    Sig: INJECT SUBCUTANEOUSLY 1.5MG  ONCE A WEEK      Endocrinology:  Diabetes - GLP-1 Receptor Agonists Failed - 03/25/2019  7:35 PM      Failed - HBA1C is between 0 and 7.9 and within 180 days    Hemoglobin A1C  Date Value Ref Range Status  02/23/2016 7.5  Final   Hgb A1c MFr Bld  Date Value Ref Range Status  04/29/2018 8.7 (H) 4.8 - 5.6 % Final    Comment:             Prediabetes: 5.7 - 6.4          Diabetes: >6.4          Glycemic control for adults with diabetes: <7.0           Failed - Valid encounter within last 6 months    Recent Outpatient Visits           8 months ago Dizziness   [facility], [place], [place]   11 months ago Type 2 diabetes mellitus with hyperglycemia, without long-term current use of insulin [facility])   [facility] [place], Marzella Schlein, MD   11 months ago Chronic pain of right knee   [facility] [place], [place], [place]   1 year ago Encounter for annual physical exam   [facility] [place], Marzella Schlein, MD   1 year ago Type 2 diabetes mellitus with hyperglycemia, without long-term current use of insulin [facility])   [facility], Marzella Schlein, MD

## 2019-04-01 NOTE — Progress Notes (Deleted)
Patient: Sean Roy Male    DOB: May 11, 1955   64 y.o.   MRN: [id number] Visit Date: 04/01/2019  Today's Provider: Shirlee Latch, MD   No chief complaint on file.  Subjective:     HPI    Diabetes Mellitus Type II, Follow-up:   Lab Results  Component Value Date   HGBA1C 8.7 (H) 04/29/2018   HGBA1C 10.1 (H) 01/09/2018   HGBA1C 7.5 02/23/2016   Last seen for diabetes 9 months ago.  Management since then includes; seen by Dortha Kern. No changes. He reports {excellent/good/fair/poor:19665} compliance with treatment. He {ACTION; IS/IS TDD:22025427} having side effects. *** Current symptoms include {Symptoms; diabetes:14075} and have been {Desc; course:15616}. Home blood sugar records: {diabetes glucometry results:16657}  Episodes of hypoglycemia? {yes***/no:17258}   Current Insulin Regimen: *** Most Recent Eye Exam: *** Weight trend: {trend:16658} Prior visit with dietician: {yes/no:17258} Current diet: {diet habits:16563} Current exercise: {exercise types:16438}  ------------------------------------------------------------------------   Hypertension, follow-up:  BP Readings from Last 3 Encounters:  07/11/18 (!) 147/89  04/29/18 (!) 159/96  04/23/18 (!) 140/100    He was last seen for hypertension 9 months ago.  BP at that visit was 147/89. Management since that visit includes;seen by [employer]. No changes. Advised pt to, restrict salt intake  .He reports {excellent/good/fair/poor:19665} compliance with treatment. He {ACTION; IS/IS CWC:37628315} having side effects. *** He {is/is not:9024} exercising. He {is/is not:9024} adherent to low salt diet.   Outside blood pressures are ***. He is experiencing {Symptoms; cardiac:12860}.  Patient denies {Symptoms; cardiac:12860}.   Cardiovascular risk factors include {cv risk factors:510}.  Use of agents associated with hypertension: {bp agents assoc with hypertension:511::"none"}.    ------------------------------------------------------------------------    Lipid/Cholesterol, Follow-up:   Last seen for this {1-12:18279} {days/wks/mos/yrs:310907} ago.  Management since that visit includes ***.  Last Lipid Panel:    Component Value Date/Time   CHOL 277 (H) 01/09/2018 1006   TRIG 133 01/09/2018 1006   HDL 48 01/09/2018 1006   CHOLHDL 5.8 (H) 01/09/2018 1006   LDLCALC 202 (H) 01/09/2018 1006    He reports {excellent/good/fair/poor:19665} compliance with treatment. He {ACTION; IS/IS VVO:16073710} having side effects. ***  Wt Readings from Last 3 Encounters:  07/11/18 265 lb 9.6 oz (120.5 kg)  04/29/18 267 lb 6.4 oz (121.3 kg)  04/23/18 268 lb 3.2 oz (121.7 kg)    ------------------------------------------------------------------------    No Known Allergies   Current Outpatient Medications:  .  allopurinol (ZYLOPRIM) 300 MG tablet, Take 2 tablets (600 mg total) by mouth daily., Disp: 60 tablet, Rfl: 3 .  amLODipine (NORVASC) 5 MG tablet, Take 1 tablet (5 mg total) by mouth daily., Disp: 30 tablet, Rfl: 3 .  atorvastatin (LIPITOR) 40 MG tablet, Take 1 tablet (40 mg total) by mouth daily., Disp: 90 tablet, Rfl: 3 .  colchicine 0.6 MG tablet, Take 1 tablet (0.6 mg total) by mouth 2 (two) times daily., Disp: 60 tablet, Rfl: 5 .  empagliflozin (JARDIANCE) 10 MG TABS tablet, Take 10 mg by mouth daily., Disp: 30 tablet, Rfl: 3 .  glucose blood (ONE TOUCH ULTRA TEST) test strip, Use 1 strip  once a day  [DX 250.00], Disp: , Rfl:  .  glyBURIDE-metformin (GLUCOVANCE) 2.5-500 MG tablet, TAKE 2 TABLETS BY MOUTH TWO TIMES DAILY WITH MEALS, Disp: 360 tablet, Rfl: 3 .  indomethacin (INDOCIN) 50 MG capsule, TAKE ONE CAPSULE BY MOUTH 3 TIMES A DAY AS NEEDED, Disp: , Rfl:  .  lisinopril (ZESTRIL) 40 MG tablet, Take 1  tablet (40 mg total) by mouth daily., Disp: 90 tablet, Rfl: 1 .  TRULICITY 1.5 YB/0.1BP SOPN, INJECT SUBCUTANEOUSLY 1.5MG  ONCE A WEEK, Disp: 6 mL, Rfl:  0  Review of Systems  Constitutional: Negative for appetite change, chills and fever.  Respiratory: Negative for chest tightness, shortness of breath and wheezing.   Cardiovascular: Negative for chest pain and palpitations.  Gastrointestinal: Negative for abdominal pain, nausea and vomiting.    Social History   Tobacco Use  . Smoking status: Never Smoker  . Smokeless tobacco: Never Used  Substance Use Topics  . Alcohol use: Yes    Alcohol/week: 3.0 standard drinks    Types: 3 Shots of liquor per week    Comment: occasional      Objective:   There were no vitals taken for this visit. There were no vitals filed for this visit.There is no height or weight on file to calculate BMI.   Physical Exam   No results found for any visits on 04/02/19.     Assessment & Plan        Lavon Paganini, MD  [facility]

## 2019-04-02 ENCOUNTER — Encounter: Payer: Self-pay | Admitting: Family Medicine

## 2019-04-02 ENCOUNTER — Ambulatory Visit: Payer: Self-pay | Admitting: Family Medicine

## 2019-04-02 NOTE — Progress Notes (Signed)
Patient with 3+ no shows in our clinic and multiple last-minute cancellations.  He will be dismissed from [facility]

## 2019-05-16 LAB — HM DIABETES EYE EXAM

## 2019-05-20 ENCOUNTER — Encounter: Payer: Self-pay | Admitting: Family Medicine

## 2019-05-23 ENCOUNTER — Other Ambulatory Visit: Payer: Self-pay | Admitting: Family Medicine

## 2019-06-23 ENCOUNTER — Other Ambulatory Visit: Payer: Self-pay | Admitting: Family Medicine

## 2019-06-23 ENCOUNTER — Ambulatory Visit: Payer: 59 | Attending: Internal Medicine

## 2019-06-23 DIAGNOSIS — Z23 Encounter for immunization: Secondary | ICD-10-CM

## 2019-06-23 NOTE — Progress Notes (Signed)
   Covid-19 Vaccination Clinic  Name:  Sean Roy    MRN: [id number] DOB: Feb 19, 1956  06/23/2019  Sean Roy was observed post Covid-19 immunization for 15 minutes without incident. He was provided with Vaccine Information Sheet and instruction to access the V-Safe system.   Sean Roy was instructed to call 911 with any severe reactions post vaccine: Marland Kitchen Difficulty breathing  . Swelling of face and throat  . A fast heartbeat  . A bad rash all over body  . Dizziness and weakness   Immunizations Administered    Name Date Dose VIS Date Route   Pfizer COVID-19 Vaccine 06/23/2019  8:24 AM 0.3 mL 02/28/2019 Intramuscular   Manufacturer: [employer], [employer]   Lot: [id number]   NDC: [address]-9

## 2019-07-15 ENCOUNTER — Ambulatory Visit: Payer: 59 | Attending: Internal Medicine

## 2019-07-15 DIAGNOSIS — Z23 Encounter for immunization: Secondary | ICD-10-CM

## 2019-07-15 NOTE — Progress Notes (Signed)
   Covid-19 Vaccination Clinic  Name:  Sean Roy    MRN: [id number] DOB: 19-Sep-1955  07/15/2019  [name] was observed post Covid-19 immunization for 15 minutes without incident. He was provided with Vaccine Information Sheet and instruction to access the V-Safe system.   [name] was instructed to call 911 with any severe reactions post vaccine: Marland Kitchen Difficulty breathing  . Swelling of face and throat  . A fast heartbeat  . A bad rash all over body  . Dizziness and weakness   Immunizations Administered    Name Date Dose VIS Date Route   Pfizer COVID-19 Vaccine 07/15/2019  4:21 PM 0.3 mL 05/14/2018 Intramuscular   Manufacturer: [employer], [employer]   Lot: [id number]   NDC: [address]-6

## 2020-03-04 ENCOUNTER — Encounter: Payer: 59 | Admitting: Dermatology

## 2020-05-30 ENCOUNTER — Other Ambulatory Visit: Payer: Self-pay | Admitting: Dermatology

## 2020-06-23 ENCOUNTER — Ambulatory Visit: Payer: 59 | Admitting: Dermatology

## 2020-06-23 ENCOUNTER — Other Ambulatory Visit: Payer: Self-pay

## 2020-06-23 DIAGNOSIS — L219 Seborrheic dermatitis, unspecified: Secondary | ICD-10-CM | POA: Diagnosis not present

## 2020-06-23 MED ORDER — KETOCONAZOLE 2 % EX CREA: TOPICAL_CREAM | CUTANEOUS | 2 refills | Status: DC

## 2020-06-23 MED ORDER — MOMETASONE FUROATE 0.1 % EX CREA: TOPICAL_CREAM | CUTANEOUS | 2 refills | Status: DC

## 2020-06-23 MED ORDER — TACROLIMUS 0.1 % EX OINT: TOPICAL_OINTMENT | CUTANEOUS | 2 refills | Status: DC

## 2020-06-23 NOTE — Progress Notes (Signed)
   Follow-Up Visit   Subjective  Sean Roy is a 65 y.o. male who presents for the following: Follow-up.  Patient here for prescription refills. He has seborrheic dermatitis of the face, controlled alternating every other day with ketoconazole 2% cream and hydrocortisone 2.5% cream. He still has flares off and on pretty frequently and needs to use the [facility] regularly. He uses Eucerin Cream for a moisturizer.  The following portions of the chart were reviewed this encounter and updated as appropriate:       Review of Systems:  No other skin or systemic complaints except as noted in HPI or Assessment and Plan.  Objective  Well appearing patient in no apparent distress; mood and affect are within normal limits.  A focused examination was performed including face. Relevant physical exam findings are noted in the Assessment and Plan.  Objective  face, chest: Hypo and hyperpigmented scaly patches on forehead, temporal hairline, sideburn.   Assessment & Plan  Seborrheic dermatitis face, chest  Seborrheic Dermatitis with flare off of topical meds -  is a chronic persistent rash characterized by pinkness and scaling most commonly of the mid face but also can occur on the scalp (dandruff), ears; mid chest and mid back. It tends to be exacerbated by stress and cooler weather.  People who have neurologic disease may experience new onset or exacerbation of existing seborrheic dermatitis.  The condition is not curable but treatable and can be controlled.   Start mometasone cream Spot treat AAs face/scalp/chest qd/bid times a day for flares dsp 15g 2Rf. Avoid periocular area.  Start tacrolimus 0.1% ointment Apply to AAs face qd/bid until rash improved dsp 60g 2Rf.  Safer to use on a regular basis on face than HC 2.5% cream.  Continue ketoconazole 2% cream qd/bid AAs face dsp 60g 2Rf.  Discussed this is best used daily to prevent flares     tacrolimus (PROTOPIC) 0.1 % ointment - face,  chest  ketoconazole (NIZORAL) 2 % cream - face, chest  mometasone (ELOCON) 0.1 % cream - face, chest  Return if symptoms worsen or fail to improve.   ICherlyn Labella, CMA, am acting as scribe for Willeen Niece, MD .  Documentation: I have reviewed the above documentation for accuracy and completeness, and I agree with the above.  Willeen Niece MD

## 2020-06-23 NOTE — Patient Instructions (Addendum)
Seborrheic Dermatitis  -  is a chronic persistent rash characterized by pinkness and scaling most commonly of the mid face but also can occur on the scalp (dandruff), ears; mid chest and mid back. It tends to be exacerbated by stress and cooler weather.  People who have neurologic disease may experience new onset or exacerbation of existing seborrheic dermatitis.  The condition is not curable but treatable and can be controlled.   If you have any questions or concerns for your doctor, please call our main line at [phone] and press option 4 to reach your doctor's medical assistant. If no one answers, please leave a voicemail as directed and we will return your call as soon as possible. Messages left after 4 pm will be answered the following business day.   You may also send [place] a message via MyChart. We typically respond to MyChart messages within 1-2 business days.  For prescription refills, please ask your pharmacy to contact our office. Our fax number is [phone].  If you have an urgent issue when the clinic is closed that cannot wait until the next business day, you can page your doctor at the number below.    Please note that while we do our best to be available for urgent issues outside of office hours, we are not available 24/7.   If you have an urgent issue and are unable to reach [place], you may choose to seek medical care at your doctor's office, retail clinic, urgent care center, or emergency room.  If you have a medical emergency, please immediately call 911 or go to the emergency department.  Pager Numbers  - Dr. Gwen Pounds: [phone]  - Dr. Neale Burly: [phone]  - Dr. Roseanne Reno: [phone]  In the event of inclement weather, please call our main line at [phone] for an update on the status of any delays or closures.  Dermatology Medication Tips: Please keep the boxes that topical medications come in in order to help keep track of the instructions about where and how  to use these. Pharmacies typically print the medication instructions only on the boxes and not directly on the medication tubes.   If your medication is too expensive, please contact our office at [phone] option 4 or send [place] a message through MyChart.   We are unable to tell what your co-pay for medications will be in advance as this is different depending on your insurance coverage. However, we may be able to find a substitute medication at lower cost or fill out paperwork to get insurance to cover a needed medication.   If a prior authorization is required to get your medication covered by your insurance company, please allow [place] 1-2 business days to complete this process.  Drug prices often vary depending on where the prescription is filled and some pharmacies may offer cheaper prices.  The website www.goodrx.com contains coupons for medications through different pharmacies. The prices here do not account for what the cost may be with help from insurance (it may be cheaper with your insurance), but the website can give you the price if you did not use any insurance.  - You can print the associated coupon and take it with your prescription to the pharmacy.  - You may also stop by our office during regular business hours and pick up a GoodRx coupon card.  - If you need your prescription sent electronically to a different pharmacy, notify our office through [facility] or by phone at [phone] option 4.

## 2020-06-24 ENCOUNTER — Other Ambulatory Visit: Payer: Self-pay

## 2020-06-24 DIAGNOSIS — L219 Seborrheic dermatitis, unspecified: Secondary | ICD-10-CM

## 2020-06-24 MED ORDER — KETOCONAZOLE 2 % EX CREA: TOPICAL_CREAM | CUTANEOUS | 2 refills | Status: DC

## 2020-06-24 MED ORDER — MOMETASONE FUROATE 0.1 % EX CREA: TOPICAL_CREAM | CUTANEOUS | 2 refills | Status: DC

## 2020-06-24 MED ORDER — TACROLIMUS 0.1 % EX OINT: TOPICAL_OINTMENT | CUTANEOUS | 2 refills | Status: DC

## 2020-06-24 NOTE — Progress Notes (Signed)
RX sent in to wrong pharmacy yesterday. Correction made to [facility].

## 2021-02-28 DIAGNOSIS — Z23 Encounter for immunization: Secondary | ICD-10-CM | POA: Diagnosis not present

## 2021-04-20 LAB — COLOGUARD: COLOGUARD: POSITIVE — AB

## 2021-07-06 ENCOUNTER — Other Ambulatory Visit: Payer: Self-pay | Admitting: Gastroenterology

## 2021-07-08 LAB — SURGICAL PATHOLOGY

## 2021-08-25 ENCOUNTER — Other Ambulatory Visit: Payer: Self-pay | Admitting: Dermatology

## 2021-08-25 DIAGNOSIS — L219 Seborrheic dermatitis, unspecified: Secondary | ICD-10-CM

## 2021-08-28 ENCOUNTER — Encounter: Payer: Self-pay | Admitting: Emergency Medicine

## 2021-08-28 ENCOUNTER — Ambulatory Visit
Admission: EM | Admit: 2021-08-28 | Discharge: 2021-08-28 | Disposition: A | Discharge status: Home / Self Care | Payer: 59 | Attending: Emergency Medicine | Admitting: Emergency Medicine

## 2021-08-28 DIAGNOSIS — M109 Gout, unspecified: Secondary | ICD-10-CM | POA: Diagnosis not present

## 2021-08-28 LAB — POCT FASTING CBG KUC MANUAL ENTRY: POCT Glucose (KUC): 269 mg/dL — AB (ref 70–99)

## 2021-08-28 MED ORDER — PREDNISONE 50 MG PO TABS: ORAL_TABLET | ORAL | 0 refills | Status: DC

## 2021-08-28 MED ORDER — ALLOPURINOL 100 MG PO TABS: 100.0000 mg | ORAL_TABLET | Freq: Every day | ORAL | 0 refills | Status: AC

## 2021-08-28 NOTE — Discharge Instructions (Addendum)
Watch your blood sugar.  Steroid can increase your blood sugars.  Take steroid for 5 days as long as your blood sugars controlled.  Eat a low purine diet and start allopurinol once this attack has come down.  Return at anytime for new or worsening symptoms for reevaluation

## 2021-08-28 NOTE — ED Triage Notes (Signed)
Pt reports right great toe pain x 3 days. States he suffers from gout and is having a flare up.

## 2021-08-28 NOTE — ED Provider Notes (Signed)
[facility]   MRN: [id number] DOB: 1955/04/16  Subjective:   Chief Complaint;  Chief Complaint  Patient presents with   Foot Pain  Pt reports right great toe pain x 3 days. States he suffers from gout and is having a flare up  Sean Roy is a 66 y.o. male presenting for painful flare of gout in his right great toe over the last 3 days.  He normally takes steroid for the medication when he gets a flare which resolves his symptoms.  He does not currently take allopurinol but has trended helpful in the past to prevent outbreaks.  He denies fever or trauma  No current facility-administered medications for this encounter.  Current Outpatient Medications:    allopurinol (ZYLOPRIM) 100 MG tablet, Take 1 tablet (100 mg total) by mouth daily., Disp: 30 tablet, Rfl: 0   predniSONE (DELTASONE) 50 MG tablet, Take 1 pill daily for 5 days as directed, Disp: 5 tablet, Rfl: 0   amLODipine (NORVASC) 5 MG tablet, Take 1 tablet (5 mg total) by mouth daily., Disp: 30 tablet, Rfl: 3   atorvastatin (LIPITOR) 40 MG tablet, Take 1 tablet (40 mg total) by mouth daily., Disp: 90 tablet, Rfl: 3   colchicine 0.6 MG tablet, Take 1 tablet (0.6 mg total) by mouth 2 (two) times daily., Disp: 60 tablet, Rfl: 5   empagliflozin (JARDIANCE) 10 MG TABS tablet, Take 10 mg by mouth daily., Disp: 30 tablet, Rfl: 3   glucose blood (ONE TOUCH ULTRA TEST) test strip, Use 1 strip  once a day  [DX 250.00], Disp: , Rfl:    glyBURIDE-metformin (GLUCOVANCE) 2.5-500 MG tablet, TAKE 2 TABLETS BY MOUTH TWO TIMES DAILY WITH MEALS, Disp: 360 tablet, Rfl: 3   indomethacin (INDOCIN) 50 MG capsule, TAKE ONE CAPSULE BY MOUTH 3 TIMES A DAY AS NEEDED, Disp: , Rfl:    ketoconazole (NIZORAL) 2 % cream, Apply to affected areas face 1-2 times daily until rash improved., Disp: 60 g, Rfl: 2   lisinopril (ZESTRIL) 40 MG tablet, Take 1 tablet (40 mg total) by mouth daily., Disp: 90 tablet, Rfl: 1   mometasone (ELOCON) 0.1 %  cream, Spot treat affected areas on face 1-2 times a day for flares. Avoid using around eyes., Disp: 15 g, Rfl: 2   tacrolimus (PROTOPIC) 0.1 % ointment, Apply to affected areas face 1-2 times a day until rash improved., Disp: 60 g, Rfl: 2   TRULICITY 1.5 MG/0.5ML SOPN, INJECT SUBCUTANEOUSLY 1.5MG  ONCE A WEEK, Disp: 6 mL, Rfl: 0   No Known Allergies  Past Medical History:  Diagnosis Date   Diabetes mellitus without complication (HCC)    Hyperlipidemia    Hypertension      Review of Systems  All other systems reviewed and are negative.    Objective:   Vitals: BP (!) 153/94 (BP Location: Left Arm)   Pulse 96   Temp 98.3 F (36.8 C) (Oral)   Resp 18   SpO2 97%   Physical Exam Vitals and nursing note reviewed.  Constitutional:      General: He is not in acute distress.    Appearance: He is well-developed.  HENT:     Head: Normocephalic and atraumatic.  Eyes:     Conjunctiva/sclera: Conjunctivae normal.  Cardiovascular:     Rate and Rhythm: Normal rate and regular rhythm.     Heart sounds: No murmur heard. Pulmonary:     Effort: Pulmonary effort is normal. No respiratory distress.  Breath sounds: Normal breath sounds.  Musculoskeletal:        General: Swelling and tenderness present.     Cervical back: Neck supple.     Comments: Swelling and erythema to right great toe region at the first metacarpal joint no NVD D or deformity no evidence of trauma or gross lower extremity edema  Skin:    General: Skin is warm and dry.     Capillary Refill: Capillary refill takes less than 2 seconds.  Neurological:     Mental Status: He is alert.  Psychiatric:        Mood and Affect: Mood normal.     Results for orders placed or performed during the hospital encounter of 08/28/21 (from the past 24 hour(s))  POCT CBG (manual entry)     Status: Abnormal   Collection Time: 08/28/21 11:49 AM  Result Value Ref Range   POCT Glucose ([facility]) 269 (A) 70 - 99 mg/dL    No results  found.     Assessment and Plan :   1. Acute gout involving toe of right foot, unspecified cause     Meds ordered this encounter  Medications   predniSONE (DELTASONE) 50 MG tablet    Sig: Take 1 pill daily for 5 days as directed    Dispense:  5 tablet    Refill:  0   allopurinol (ZYLOPRIM) 100 MG tablet    Sig: Take 1 tablet (100 mg total) by mouth daily.    Dispense:  30 tablet    Refill:  0    MDM:  Sean Roy is a 66 y.o. male presenting for right toe gout.  His clinical exam is consistent with this.  Blood sugar is elevated he is a known diabetic but steroid burst usually works best for him to resolve gout.  Patient will also restart allopurinol to prevent gouty attacks he will stop the steroid if the sugars become elevated routinely above 300.  I discussed todays findings, treatment plan, follow up and return instructions. Questions were answered. Patient/representative stated understanding of the instructions and patient is stable for discharge.  [name] FNP-C MSN    [name], NP 08/28/21 1152

## 2021-09-03 ENCOUNTER — Emergency Department: Payer: BC Managed Care – PPO

## 2021-09-03 ENCOUNTER — Other Ambulatory Visit: Payer: Self-pay

## 2021-09-03 ENCOUNTER — Encounter: Payer: Self-pay | Admitting: Emergency Medicine

## 2021-09-03 DIAGNOSIS — I10 Essential (primary) hypertension: Secondary | ICD-10-CM | POA: Diagnosis not present

## 2021-09-03 DIAGNOSIS — Z79899 Other long term (current) drug therapy: Secondary | ICD-10-CM | POA: Diagnosis not present

## 2021-09-03 DIAGNOSIS — M10071 Idiopathic gout, right ankle and foot: Secondary | ICD-10-CM | POA: Diagnosis not present

## 2021-09-03 DIAGNOSIS — Z7984 Long term (current) use of oral hypoglycemic drugs: Secondary | ICD-10-CM | POA: Diagnosis not present

## 2021-09-03 DIAGNOSIS — E119 Type 2 diabetes mellitus without complications: Secondary | ICD-10-CM | POA: Insufficient documentation

## 2021-09-03 DIAGNOSIS — M79674 Pain in right toe(s): Secondary | ICD-10-CM | POA: Diagnosis present

## 2021-09-03 LAB — CBC
HCT: 41.4 % (ref 39.0–52.0)
Hemoglobin: 12.9 g/dL — ABNORMAL LOW (ref 13.0–17.0)
MCH: 25.5 pg — ABNORMAL LOW (ref 26.0–34.0)
MCHC: 31.2 g/dL (ref 30.0–36.0)
MCV: 81.8 fL (ref 80.0–100.0)
Platelets: 237 10*3/uL (ref 150–400)
RBC: 5.06 MIL/uL (ref 4.22–5.81)
RDW: 14.3 % (ref 11.5–15.5)
WBC: 8.9 10*3/uL (ref 4.0–10.5)
nRBC: 0 % (ref 0.0–0.2)

## 2021-09-03 NOTE — ED Triage Notes (Signed)
Pt states seen at [facility] on Saturday, states was given prescription for steroids and allopurinol. Pt states hx of gout. Pt states only took 1 of allopurinal. Pt states continued pain to R great toe at this time.

## 2021-09-04 ENCOUNTER — Emergency Department
Admission: EM | Admit: 2021-09-04 | Discharge: 2021-09-04 | Disposition: A | Discharge status: Home / Self Care | Payer: BC Managed Care – PPO | Attending: Emergency Medicine | Admitting: Emergency Medicine

## 2021-09-04 DIAGNOSIS — M10071 Idiopathic gout, right ankle and foot: Secondary | ICD-10-CM

## 2021-09-04 LAB — BASIC METABOLIC PANEL
Anion gap: 11 (ref 5–15)
BUN: 26 mg/dL — ABNORMAL HIGH (ref 8–23)
CO2: 23 mmol/L (ref 22–32)
Calcium: 9.2 mg/dL (ref 8.9–10.3)
Chloride: 100 mmol/L (ref 98–111)
Creatinine, Ser: 1.15 mg/dL (ref 0.61–1.24)
GFR, Estimated: >60 mL/min (ref >60)
Glucose, Bld: 377 mg/dL — ABNORMAL HIGH (ref 70–99)
Potassium: 4.4 mmol/L (ref 3.5–5.1)
Sodium: 134 mmol/L — ABNORMAL LOW (ref 135–145)

## 2021-09-04 LAB — PROCALCITONIN: Procalcitonin: <0.1 ng/mL

## 2021-09-04 MED ORDER — INDOMETHACIN 50 MG PO CAPS: 50.0000 mg | ORAL_CAPSULE | Freq: Three times a day (TID) | ORAL | 0 refills | Status: DC | PRN

## 2021-09-04 MED ORDER — COLCHICINE 0.6 MG PO TABS
1.2000 mg | ORAL_TABLET | Freq: Once | ORAL | Status: AC
  Administered 2021-09-04: 1.2 mg via ORAL
  Filled 2021-09-04: qty 2

## 2021-09-04 MED ORDER — ONDANSETRON 4 MG PO TBDP: 4.0000 mg | ORAL_TABLET | Freq: Four times a day (QID) | ORAL | 0 refills | Status: DC | PRN

## 2021-09-04 MED ORDER — COLCHICINE 0.6 MG PO TABS: 0.6000 mg | ORAL_TABLET | Freq: Every day | ORAL | 0 refills | Status: DC

## 2021-09-04 MED ORDER — INDOMETHACIN 50 MG PO CAPS
50.0000 mg | ORAL_CAPSULE | Freq: Once | ORAL | Status: AC
  Administered 2021-09-04: 50 mg via ORAL
  Filled 2021-09-04: qty 1

## 2021-09-04 MED ORDER — OXYCODONE-ACETAMINOPHEN 5-325 MG PO TABS: 2.0000 | ORAL_TABLET | Freq: Four times a day (QID) | ORAL | 0 refills | Status: DC | PRN

## 2021-09-04 NOTE — Discharge Instructions (Signed)
You are being provided a prescription for opiates (also known as narcotics) for pain control.  Opiates can be addictive and should only be used when absolutely necessary for pain control when other alternatives do not work.  We recommend you only use them for the recommended amount of time and only as prescribed.  Please do not take with other sedative medications or alcohol.  Please do not drive, operate machinery, make important decisions while taking opiates.  Please note that these medications can be addictive and have high abuse potential.  Patients can become addicted to narcotics after only taking them for a few days.  Please keep these medications locked away from children, teenagers or any family members with history of substance abuse.  Narcotic pain medicine may also make you constipated.  You may use over-the-counter medications such as MiraLAX, Colace to prevent constipation.  If you become constipated, you may use over-the-counter enemas as needed.  Itching and nausea are also common side effects of narcotic pain medication.  If you develop uncontrolled vomiting or a rash, please stop these medications and seek medical care.   Steps to find a Primary Care Provider (PCP):  Call [phone] or [phone] to access "[facility] Find a Doctor Service."  2.  You may also go on the [facility] website at [email or web address]   

## 2021-09-04 NOTE — ED Provider Notes (Signed)
[facility] Provider Note    Event Date/Time   First MD Initiated Contact with Patient 09/04/21 [phone]     (approximate)   History   Foot Pain   HPI  Sean Roy is a 66 y.o. male with history of hypertension, hyperlipidemia, diabetes, gout who presents emergency department with great great toe pain for over a week.  Patient was seen in urgent care and states he was on allopurinol and steroids for 5 days without any relief.  He has not been on colchicine or Indocin but it appears he has taken these before.  States he is having a lot of pain.  No injury.  No fevers.  No vomiting or diarrhea.  Is using crutches.   History provided by patient.    Past Medical History:  Diagnosis Date   Diabetes mellitus without complication (HCC)    Hyperlipidemia    Hypertension     Past Surgical History:  Procedure Laterality Date   NO PAST SURGERIES      MEDICATIONS:  Prior to Admission medications   Medication Sig Start Date End Date Taking? Authorizing Provider  allopurinol (ZYLOPRIM) 100 MG tablet Take 1 tablet (100 mg total) by mouth daily. 08/28/21 09/27/21  [name], NP  amLODipine (NORVASC) 5 MG tablet Take 1 tablet (5 mg total) by mouth daily. 07/10/18   Erasmo Downer, MD  atorvastatin (LIPITOR) 40 MG tablet Take 1 tablet (40 mg total) by mouth daily. 02/25/18   Bacigalupo, Marzella Schlein, MD  colchicine 0.6 MG tablet Take 1 tablet (0.6 mg total) by mouth 2 (two) times daily. 01/11/18   Erasmo Downer, MD  empagliflozin (JARDIANCE) 10 MG TABS tablet Take 10 mg by mouth daily. 05/06/18   Bacigalupo, Marzella Schlein, MD  glucose blood (ONE TOUCH ULTRA TEST) test strip Use 1 strip  once a day  [DX 250.00]    [provider]  glyBURIDE-metformin (GLUCOVANCE) 2.5-500 MG tablet TAKE 2 TABLETS BY MOUTH TWO TIMES DAILY WITH MEALS 05/13/18   Bacigalupo, Marzella Schlein, MD  indomethacin (INDOCIN) 50 MG capsule TAKE ONE CAPSULE BY MOUTH 3 TIMES A DAY AS NEEDED  06/23/16   [provider]  ketoconazole (NIZORAL) 2 % cream Apply to affected areas face 1-2 times daily until rash improved. 06/24/20   Willeen Niece, MD  lisinopril (ZESTRIL) 40 MG tablet Take 1 tablet (40 mg total) by mouth daily. 07/11/18   Bacigalupo, Marzella Schlein, MD  mometasone (ELOCON) 0.1 % cream Spot treat affected areas on face 1-2 times a day for flares. Avoid using around eyes. 06/24/20   Willeen Niece, MD  predniSONE (DELTASONE) 50 MG tablet Take 1 pill daily for 5 days as directed 08/28/21   [name], NP  tacrolimus (PROTOPIC) 0.1 % ointment Apply to affected areas face 1-2 times a day until rash improved. 06/24/20   Willeen Niece, MD  TRULICITY 1.5 MG/0.5ML [facility] INJECT SUBCUTANEOUSLY 1.5MG  ONCE A WEEK 03/26/19   Erasmo Downer, MD    Physical Exam   Triage Vital Signs: ED Triage Vitals  Enc Vitals Group     BP 09/03/21 2315 (!) 160/89     Pulse Rate 09/03/21 2315 98     Resp 09/03/21 2315 18     Temp 09/03/21 2315 99.5 F (37.5 C)     Temp Source 09/03/21 2315 Oral     SpO2 09/03/21 2315 96 %     Weight 09/03/21 2321 260 lb (117.9 kg)  Height 09/03/21 2321 5\' 11"  (1.803 m)     Head Circumference --      Peak Flow --      Pain Score 09/03/21 2321 10     Pain Loc --      Pain Edu? --      Excl. in GC? --     Most recent vital signs: Vitals:   09/04/21 0253 09/04/21 0507  BP: (!) 149/99 (!) 148/94  Pulse: 93 92  Resp: 16 20  Temp: 99.6 F (37.6 C)   SpO2: 100% 98%    CONSTITUTIONAL: Alert and oriented and responds appropriately to questions. Well-appearing; well-nourished HEAD: Normocephalic, atraumatic EYES: Conjunctivae clear, pupils appear equal, sclera nonicteric ENT: normal nose; moist mucous membranes NECK: Supple, normal ROM CARD: RRR; S1 and S2 appreciated; no murmurs, no clicks, no rubs, no gallops RESP: Normal chest excursion without splinting or tachypnea; breath sounds clear and equal bilaterally; no wheezes, no rhonchi, no rales, no  hypoxia or respiratory distress, speaking full sentences ABD/GI: Normal bowel sounds; non-distended; soft, non-tender, no rebound, no guarding, no peritoneal signs BACK: The back appears normal EXT: Right great toe is swollen and red but not significantly warm.  He has normal capillary refill and a 2+ right DP pulse.  No bony deformity.  No calf tenderness or calf swelling.  Normal sensation.  Compartments soft. SKIN: Normal color for age and race; warm; no rash on exposed skin NEURO: Moves all extremities equally, normal speech PSYCH: The patient's mood and manner are appropriate.   ED Results / Procedures / Treatments   LABS: (all labs ordered are listed, but only abnormal results are displayed) Labs Reviewed  CBC - Abnormal; Notable for the following components:      Result Value   Hemoglobin 12.9 (*)    MCH 25.5 (*)    All other components within normal limits  BASIC METABOLIC PANEL - Abnormal; Notable for the following components:   Sodium 134 (*)    Glucose, Bld 377 (*)    BUN 26 (*)    All other components within normal limits  PROCALCITONIN     EKG:  EKG Interpretation  Date/Time:    Ventricular Rate:    PR Interval:    QRS Duration:   QT Interval:    QTC Calculation:   R Axis:     Text Interpretation:           RADIOLOGY: My personal review and interpretation of imaging: X-ray of the right foot shows no acute abnormality.  I have personally reviewed all radiology reports.   DG Foot Complete Right  Result Date: 09/04/2021 CLINICAL DATA:  Right foot pain, gout EXAM: RIGHT FOOT COMPLETE - 3+ VIEW COMPARISON:  None Available. FINDINGS: Normal alignment. No acute fracture or dislocation. 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 There is mild soft tissue swelling of the right forefoot. Vascular calcifications noted within the right foot. IMPRESSION: Mild soft tissue swelling. No acute fracture or dislocation. Electronically  Signed   By: 09/06/2021 M.D.   On: 09/04/2021 00:45     PROCEDURES:  Critical Care performed: No     Procedures    IMPRESSION / MDM / ASSESSMENT AND PLAN / ED COURSE  I reviewed the triage vital signs and the nursing notes.    Patient here with acute gout flare.  No relief with steroids.    DIFFERENTIAL DIAGNOSIS (includes but not limited to):   Gout, less likely cellulitis,  septic arthritis, fracture, compartment syndrome, DVT, arterial obstruction   Patient's presentation is most consistent with acute presentation with potential threat to life or bodily function.   PLAN: We will obtain CBC, BMP, procalcitonin, x-ray.  Does have low-grade temps of 99.6 but my suspicion for infection is much lower.  He states this feels similar to gout that he has had previously but it is not improving with steroids.  I am not quite sure why he was not put on colchicine and Indocin.  He has no history of chronic kidney disease.  He has tolerated these medications before.   MEDICATIONS GIVEN IN ED: Medications  colchicine tablet 1.2 mg (1.2 mg Oral Given 09/04/21 0509)  indomethacin (INDOCIN) capsule 50 mg (50 mg Oral Given 09/04/21 0509)     ED COURSE: Patient's labs reassuring.  No leukocytosis.  Negative procalcitonin.  Normal creatinine.  X-ray reviewed/interpreted by myself and radiology and shows no bony abnormality.  We will give colchicine, Indocin here.  He drove himself to the emergency department.  Will discharge with prescription of Percocet.  Discussed supportive care instructions and return precautions.  No indication for antibiotics at this time.  Again low suspicion for cellulitis, septic arthritis.   CONSULTS: No emergent consult needed at this time.  Patient well-appearing with reassuring work-up, x-ray.   OUTSIDE RECORDS REVIEWED: Reviewed patient's last urgent care note on 07/20/2021 and last cardiology note on 08/08/2021.       FINAL CLINICAL IMPRESSION(S) / ED  DIAGNOSES   Final diagnoses:  Acute idiopathic gout involving toe of right foot     Rx / DC Orders   ED Discharge Orders          Ordered    colchicine 0.6 MG tablet  Daily        09/04/21 0506    indomethacin (INDOCIN) 50 MG capsule  3 times daily PRN        09/04/21 0506    oxyCODONE-acetaminophen (PERCOCET) 5-325 MG tablet  Every 6 hours PRN        09/04/21 0506    ondansetron (ZOFRAN-ODT) 4 MG disintegrating tablet  Every 6 hours PRN        09/04/21 0506             Note:  This document was prepared using Dragon voice recognition software and may include unintentional dictation errors.   Ares Cardozo, Layla Maw, DO 09/04/21 [phone]

## 2021-09-04 NOTE — ED Notes (Signed)
Lab called to add on procalcitonin.  ?

## 2021-10-25 ENCOUNTER — Ambulatory Visit: Payer: BC Managed Care – PPO | Admitting: Dermatology

## 2021-12-05 ENCOUNTER — Ambulatory Visit (INDEPENDENT_AMBULATORY_CARE_PROVIDER_SITE_OTHER): Payer: BC Managed Care – PPO | Admitting: Dermatology

## 2021-12-05 DIAGNOSIS — L219 Seborrheic dermatitis, unspecified: Secondary | ICD-10-CM | POA: Diagnosis not present

## 2021-12-05 MED ORDER — ZORYVE 0.3 % EX CREA: TOPICAL_CREAM | CUTANEOUS | 2 refills | Status: DC

## 2021-12-05 NOTE — Progress Notes (Signed)
   Follow-Up Visit   Subjective  Sean Roy is a 66 y.o. male who presents for the following: Seborrheic Dermatitis (Patient here today for mometasone and tacrolimus refills. Patient advises controlled on current treatment. ). He uses topicals almost every day to aas face.   The following portions of the chart were reviewed this encounter and updated as appropriate:       Review of Systems:  No other skin or systemic complaints except as noted in HPI or Assessment and Plan.  Objective  Well appearing patient in no apparent distress; mood and affect are within normal limits.  A focused examination was performed including face, chest. Relevant physical exam findings are noted in the Assessment and Plan.  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 Condition is symptomatic/ bothersome to patient. Not currently at goal.   Patient advised mometasone is topical steroid and should not be used frequently on the face due to risk of skin thinning.   Start Zoryve cream once daily to affected areas as needed. Samples given.  Lot # [id number] Exp: 02/2023  If not covered or too expensive, will send in mometasone and tacrolimus as before  Recommend light moisturizer to face and a zinc based soap (Z bar).    Roflumilast (ZORYVE) 0.3 % CREA - face Apply to affected areas once daily as needed for rash.   Return in about 2 months (around 02/04/2022) for [facility].  Graciella Belton, RMA, am acting as scribe for Brendolyn Patty, MD .  Documentation: I have reviewed the above documentation for accuracy and completeness, and I agree with the above.  Brendolyn Patty MD

## 2021-12-05 NOTE — Patient Instructions (Addendum)
Recommend vanicream z bar soap.  Start Zoryve samples once daily to affected areas as needed.   Due to recent changes in healthcare laws, you may see results of your pathology and/or laboratory studies on MyChart before the doctors have had a chance to review them. We understand that in some cases there may be results that are confusing or concerning to you. Please understand that not all results are received at the same time and often the doctors may need to interpret multiple results in order to provide you with the best plan of care or course of treatment. Therefore, we ask that you please give [place] 2 business days to thoroughly review all your results before contacting the office for clarification. Should we see a critical lab result, you will be contacted sooner.   If You Need Anything After Your Visit  If you have any questions or concerns for your doctor, please call our main line at [phone] and press option 4 to reach your doctor's medical assistant. If no one answers, please leave a voicemail as directed and we will return your call as soon as possible. Messages left after 4 pm will be answered the following business day.   You may also send [place] a message via [place]. We typically respond to MyChart messages within 1-2 business days.  For prescription refills, please ask your pharmacy to contact our office. Our fax number is [phone].  If you have an urgent issue when the clinic is closed that cannot wait until the next business day, you can page your doctor at the number below.    Please note that while we do our best to be available for urgent issues outside of office hours, we are not available 24/7.   If you have an urgent issue and are unable to reach [place], you may choose to seek medical care at your doctor's office, retail clinic, urgent care center, or emergency room.  If you have a medical emergency, please immediately call 911 or go to the emergency department.  Pager  Numbers  - Dr. Nehemiah Massed: [phone]  - Dr. Laurence Ferrari: [phone]  - Dr. Nicole Kindred: [phone]  In the event of inclement weather, please call our main line at [phone] for an update on the status of any delays or closures.  Dermatology Medication Tips: Please keep the boxes that topical medications come in in order to help keep track of the instructions about where and how to use these. Pharmacies typically print the medication instructions only on the boxes and not directly on the medication tubes.   If your medication is too expensive, please contact our office at [phone] option 4 or send [place] a message through [place].   We are unable to tell what your co-pay for medications will be in advance as this is different depending on your insurance coverage. However, we may be able to find a substitute medication at lower cost or fill out paperwork to get insurance to cover a needed medication.   If a prior authorization is required to get your medication covered by your insurance company, please allow [place] 1-2 business days to complete this process.  Drug prices often vary depending on where the prescription is filled and some pharmacies may offer cheaper prices.  The website www.goodrx.com contains coupons for medications through different pharmacies. The prices here do not account for what the cost may be with help from insurance (it may be cheaper with your insurance), but the website can give you the price if  you did not use any insurance.  - You can print the associated coupon and take it with your prescription to the pharmacy.  - You may also stop by our office during regular business hours and pick up a GoodRx coupon card.  - If you need your prescription sent electronically to a different pharmacy, notify our office through [facility] or by phone at [phone] option 4.     Si Usted Necesita Algo Despus de Su Visita  Tambin puede enviarnos un mensaje a travs de  Pharmacist, community. Por lo general respondemos a los mensajes de MyChart en el transcurso de 1 a 2 das hbiles.  Para renovar recetas, por favor pida a su farmacia que se ponga en contacto con nuestra oficina. Harland Dingwall de fax es [place] [phone].  Si tiene un asunto urgente cuando la clnica est cerrada y que no puede esperar hasta el siguiente da hbil, puede llamar/localizar a su doctor(a) al nmero que aparece a continuacin.   Por favor, tenga en cuenta que aunque hacemos todo lo posible para estar disponibles para asuntos urgentes fuera del horario de [place], no estamos disponibles las 24 horas del da, los 7 das de la [place].   Si tiene un problema urgente y no puede comunicarse con nosotros, puede optar por buscar atencin mdica  en el consultorio de su doctor(a), en una clnica privada, en un centro de atencin urgente o en una sala de emergencias.  Si tiene Engineering geologist, por favor llame inmediatamente al 911 o vaya a la sala de emergencias.  Nmeros de bper  - Dr. Nehemiah Massed: [phone]  - Dra. Moye: [phone]  - Dra. Nicole Kindred: [phone]  En caso de inclemencias del [place], por favor llame a Johnsie Kindred principal al [phone] para una actualizacin sobre el [place] de cualquier retraso o cierre.  Consejos para la medicacin en dermatologa: Por favor, guarde las cajas en las que vienen los medicamentos de uso tpico para ayudarle a seguir las instrucciones sobre dnde y cmo usarlos. Las farmacias generalmente imprimen las instrucciones del medicamento slo en las cajas y no directamente en los tubos del [place].   Si su medicamento es muy caro, por favor, pngase en contacto con Zigmund Daniel llamando al [phone] y presione la opcin 4 o envenos un mensaje a travs de Pharmacist, community.   No podemos decirle cul ser su copago por los medicamentos por adelantado ya que esto es diferente dependiendo de la cobertura de su seguro. Sin embargo, es posible que  podamos encontrar un medicamento sustituto a Electrical engineer un formulario para que el seguro cubra el medicamento que se considera necesario.   Si se requiere una autorizacin previa para que su compaa de seguros [place] su medicamento, por favor permtanos de 1 a 2 das hbiles para completar este proceso.  Los precios de los medicamentos varan con frecuencia dependiendo del Environmental consultant de dnde se surte la receta y alguna farmacias pueden ofrecer precios ms baratos.  El sitio web www.goodrx.com tiene cupones para medicamentos de Airline pilot. Los precios aqu no tienen en cuenta lo que podra costar con la ayuda del seguro (puede ser ms barato con su seguro), pero el sitio web puede darle el precio si no utiliz Research scientist (physical sciences).  - Puede imprimir el cupn correspondiente y llevarlo con su receta a la farmacia.  - Tambin puede pasar por nuestra oficina durante el horario de atencin regular y Charity fundraiser una tarjeta de cupones de GoodRx.  - Si necesita que su receta  se enve electrnicamente a una farmacia diferente, informe a nuestra oficina a travs de MyChart de [place] o por telfono llamando al [phone] y presione la opcin 4.

## 2021-12-23 ENCOUNTER — Ambulatory Visit
Admission: EM | Admit: 2021-12-23 | Discharge: 2021-12-23 | Disposition: A | Discharge status: Home / Self Care | Payer: BC Managed Care – PPO | Attending: Emergency Medicine | Admitting: Emergency Medicine

## 2021-12-23 ENCOUNTER — Ambulatory Visit (INDEPENDENT_AMBULATORY_CARE_PROVIDER_SITE_OTHER): Payer: BC Managed Care – PPO

## 2021-12-23 ENCOUNTER — Encounter: Payer: Self-pay | Admitting: Emergency Medicine

## 2021-12-23 DIAGNOSIS — M25561 Pain in right knee: Secondary | ICD-10-CM

## 2021-12-23 DIAGNOSIS — I10 Essential (primary) hypertension: Secondary | ICD-10-CM

## 2021-12-23 MED ORDER — IBUPROFEN 600 MG PO TABS
600.0000 mg | ORAL_TABLET | Freq: Four times a day (QID) | ORAL | 0 refills | Status: DC | PRN
Start: 2021-12-23 — End: 2022-07-31

## 2021-12-23 NOTE — ED Triage Notes (Signed)
Pt presents with a history of right knee pain he denies any injury. His knee has been hurting for the past few weeks. Pt denies taking any medication for the pain.

## 2021-12-23 NOTE — Discharge Instructions (Addendum)
Take the ibuprofen as prescribed.  Rest and elevate your knee.  Apply ice packs 2-3 times a day for up to 20 minutes each.  Wear the knee sleeve as needed for comfort.  Follow up with an orthopedist such as the one listed below.    Your blood pressure is elevated today at 177/82.  Please have this rechecked by your primary care provider in 1-2 weeks.    Take your blood pressure medications as directed.

## 2021-12-23 NOTE — ED Provider Notes (Signed)
Sean Roy    CSN: [id number] Arrival date & time: 12/23/21  1642      History   Chief Complaint Chief Complaint  Patient presents with   Knee Pain    HPI Sean Roy is a 66 y.o. male.  Patient presents with 3-day history of right knee pain.  No falls or injury.  Patient states Sean Roy gets up and down a lot at work.  His pain is worse with changing position from sitting to standing, with weightbearing, and with ambulation.  The pain improves with rest.  No OTC medications attempted.  Treatment at home with wrapping knee in coban.  No numbness, weakness, paresthesias, wounds, redness, bruising, swelling, or other symptoms.  His medical history includes hypertension, diabetes, morbid obesity, chronic gouty arthropathy, degenerative arthritis.  Patient has not taken his blood pressure medication in 3-4 days; Sean Roy states Sean Roy had no specific reason for not taking them; just didn't want to.   The history is provided by the patient and medical records.    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 Authorizing Provider  ibuprofen (ADVIL) 600 MG tablet Take 1 tablet (600 mg total) by  mouth every 6 (six) hours as needed. 12/23/21  Yes Mickie Bail, NP  allopurinol (ZYLOPRIM) 100 MG tablet Take 1 tablet (100 mg total) by mouth daily. 08/28/21 09/27/21  [name], NP  amLODipine (NORVASC) 5 MG tablet Take 1 tablet (5 mg total) by mouth daily. 07/10/18   Erasmo Downer, MD  atorvastatin (LIPITOR) 40 MG tablet Take 1 tablet (40 mg total) by mouth daily. 02/25/18   Erasmo Downer, MD  empagliflozin (JARDIANCE) 10 MG TABS tablet Take 10 mg by mouth daily. 05/06/18   Bacigalupo, Marzella Schlein, MD  glucose blood (ONE TOUCH ULTRA TEST) test strip Use 1 strip  once a day  [DX 250.00]    [provider]  glyBURIDE-metformin (GLUCOVANCE) 2.5-500 MG tablet TAKE 2 TABLETS BY MOUTH TWO TIMES DAILY WITH MEALS 05/13/18   Bacigalupo, Marzella Schlein, MD  lisinopril (ZESTRIL) 40 MG tablet Take 1 tablet (40 mg total) by mouth daily. 07/11/18   Erasmo Downer, MD  Roflumilast (ZORYVE) 0.3 % CREA Apply to affected areas once daily as needed for rash. 12/05/21   Willeen Niece, MD  TRULICITY 1.5 MG/0.5ML SOPN INJECT SUBCUTANEOUSLY 1.5MG  ONCE A WEEK 03/26/19   Bacigalupo, Marzella Schlein, MD    Family History Family History  Problem Relation Age of Onset   Healthy Sister    Healthy Brother    Healthy Brother    Healthy Sister     Social History Social History   Tobacco Use  Smoking status: Never   Smokeless tobacco: Never  Vaping Use   Vaping Use: Never used  Substance Use Topics   Alcohol use: Yes    Alcohol/week: 3.0 standard drinks of alcohol    Types: 3 Shots of liquor per week    Comment: occasional   Drug use: No     Allergies   Patient has no known allergies.   Review of Systems Review of Systems  Constitutional:  Negative for chills and fever.  Musculoskeletal:  Positive for arthralgias and gait problem. Negative for joint swelling.  Skin:  Negative for color change, rash and wound.  Neurological:  Negative for weakness and numbness.  All other systems reviewed  and are negative.    Physical Exam Triage Vital Signs ED Triage Vitals  Enc Vitals Group     BP      Pulse      Resp      Temp      Temp src      SpO2      Weight      Height      Head Circumference      Peak Flow      Pain Score      Pain Loc      Pain Edu?      Excl. in [employer]?    No data found.  Updated Vital Signs BP (!) 177/82   Pulse 89   Temp 98.1 F (36.7 C)   Resp 18   SpO2 98%   Visual Acuity Right Eye Distance:   Left Eye Distance:   Bilateral Distance:    Right Eye Near:   Left Eye Near:    Bilateral Near:     Physical Exam Vitals and nursing note reviewed.  Constitutional:      General: Sean Roy is not in acute distress.    Appearance: Sean Roy is well-developed. Sean Roy is obese. Sean Roy is not ill-appearing.  HENT:     Mouth/Throat:     Mouth: Mucous membranes are moist.  Cardiovascular:     Rate and Rhythm: Normal rate and regular rhythm.     Heart sounds: Normal heart sounds.  Pulmonary:     Effort: Pulmonary effort is normal. No respiratory distress.     Breath sounds: Normal breath sounds.  Musculoskeletal:        General: Tenderness present. No swelling or deformity. Normal range of motion.     Cervical back: Neck supple.       Legs:  Skin:    General: Skin is warm and dry.     Capillary Refill: Capillary refill takes less than 2 seconds.     Findings: No bruising, erythema, lesion or rash.  Neurological:     General: No focal deficit present.     Mental Status: Sean Roy is alert and oriented to person, place, and time.     Sensory: No sensory deficit.     Motor: No weakness.     Gait: Gait abnormal.  Psychiatric:        Mood and Affect: Mood normal.        Behavior: Behavior normal.      UC Treatments / Results  Labs (all labs ordered are listed, but only abnormal results are displayed) Labs Reviewed - No data to display  EKG   Radiology DG Knee Complete 4 Views Right  Result Date: 12/23/2021 CLINICAL DATA:  Right knee pain for several  weeks. No known injury. EXAM: RIGHT KNEE - COMPLETE 4+  VIEW COMPARISON:  12/02/2010 FINDINGS: Moderate medial joint space narrowing and associated mild spur formation with progression. Minimal lateral and mild patellofemoral spur formation. Minimal effusion. No fracture or dislocation. IMPRESSION: 1. Tricompartmental degenerative changes with progression. 2. Minimal effusion. Electronically Signed   By: Beckie Salts M.D.   On: 12/23/2021 17:12    Procedures Procedures (including critical care time)  Medications Ordered in UC Medications - No data to display  Initial Impression / Assessment and Plan / UC Course  I have reviewed the triage vital signs and the nursing notes.  Pertinent labs & imaging results that were available during my care of the patient were reviewed by me and considered in my medical decision making (see chart for details).    Right knee pain.  Elevated blood pressure reading with hypertension.  X-ray of right knee shows degenerative changes and minimal effusion.  Treating with ibuprofen, rest, elevation, ice packs, knee sleeve.  Instructed patient to follow-up with an orthopedist.  Contact information for on-call Ortho provided.  Education provided on knee pain.  Discussed with patient that his blood pressure is elevated today.  Patient has not taken his antihypertensive medications in several days.  Sean Roy agrees to go home and take his medicines now.  Discussed compliance with medication regimen.  Instructed patient to have his blood pressure rechecked by his PCP in 1 to 2 weeks.  Education provided on managing hypertension.  Patient agrees to plan of care.  Final Clinical Impressions(s) / UC Diagnoses   Final diagnoses:  Acute pain of right knee  Elevated blood pressure reading in office with diagnosis of hypertension     Discharge Instructions      Take the ibuprofen as prescribed.  Rest and elevate your knee.  Apply ice packs 2-3 times a day for up to 20 minutes  each.  Wear the knee sleeve as needed for comfort.  Follow up with an orthopedist such as the one listed below.    Your blood pressure is elevated today at 177/82.  Please have this rechecked by your primary care provider in 1-2 weeks.    Take your blood pressure medications as directed.        ED Prescriptions     Medication Sig Dispense Auth. Provider   ibuprofen (ADVIL) 600 MG tablet Take 1 tablet (600 mg total) by mouth every 6 (six) hours as needed. 30 tablet Mickie Bail, NP      I have reviewed the PDMP during this encounter.   Mickie Bail, NP 12/23/21 1729

## 2021-12-26 ENCOUNTER — Ambulatory Visit: Payer: BC Managed Care – PPO | Admitting: Dermatology

## 2022-02-06 ENCOUNTER — Ambulatory Visit: Payer: BC Managed Care – PPO | Admitting: Dermatology

## 2022-02-08 ENCOUNTER — Telehealth: Payer: Self-pay

## 2022-02-08 MED ORDER — TACROLIMUS 0.1 % EX OINT: TOPICAL_OINTMENT | CUTANEOUS | 0 refills | Status: DC

## 2022-02-08 MED ORDER — MOMETASONE FUROATE 0.1 % EX CREA: 1.0000 | TOPICAL_CREAM | CUTANEOUS | 0 refills | Status: DC

## 2022-02-08 NOTE — Telephone Encounter (Signed)
Patient called wanting Rfs of Mometasone and Tacrolimus. Unable to afford Zoryve. 1 RF sent in for medications and rescheduled 2 month follow up.

## 2022-02-08 NOTE — Addendum Note (Signed)
Addended by: Dorathy Daft R on: 02/08/2022 02:00 PM   Modules accepted: Orders

## 2022-02-23 DIAGNOSIS — M1711 Unilateral primary osteoarthritis, right knee: Secondary | ICD-10-CM | POA: Insufficient documentation

## 2022-03-15 ENCOUNTER — Ambulatory Visit: Payer: BC Managed Care – PPO | Admitting: Dermatology

## 2022-03-15 ENCOUNTER — Telehealth: Payer: Self-pay

## 2022-03-15 NOTE — Telephone Encounter (Signed)
Discussed high no show rate with Dr. Roseanne Reno and patient being rude to nurse and front staff on phone. Dr. Roseanne Reno has Oked Rfs up until September 2024 from previous office visit. If patient calls to schedule appointment, he is to be advised that if he no shows again, he will be dismissed. aw

## 2022-06-28 ENCOUNTER — Other Ambulatory Visit: Payer: Self-pay

## 2022-06-28 ENCOUNTER — Emergency Department
Admission: EM | Admit: 2022-06-28 | Discharge: 2022-06-28 | Disposition: A | Discharge status: Home / Self Care | Payer: No Typology Code available for payment source | Attending: Emergency Medicine | Admitting: Emergency Medicine

## 2022-06-28 DIAGNOSIS — H9202 Otalgia, left ear: Secondary | ICD-10-CM | POA: Diagnosis present

## 2022-06-28 DIAGNOSIS — H6992 Unspecified Eustachian tube disorder, left ear: Secondary | ICD-10-CM | POA: Insufficient documentation

## 2022-06-28 MED ORDER — CETIRIZINE HCL 10 MG PO TABS: 10.0000 mg | ORAL_TABLET | Freq: Every day | ORAL | 0 refills | Status: DC

## 2022-06-28 MED ORDER — FLUTICASONE PROPIONATE 50 MCG/ACT NA SUSP
1.0000 | Freq: Two times a day (BID) | NASAL | 0 refills | Status: DC
Start: 2022-06-28 — End: 2023-04-05

## 2022-06-28 NOTE — ED Triage Notes (Signed)
Pt reports left ear pain for one month.  Pt playing on phone in triage.

## 2022-06-28 NOTE — ED Provider Notes (Signed)
[facility] Provider Note  Patient Contact: 4:59 PM (approximate)   History   Otalgia   HPI  Sean Roy is a 67 y.o. male who presents the emergency department complaining of an intermittent pain to the left ear.  He states that it is typically there with breathing or swallowing.  It is not super painful.  Is been ongoing times a month.  He has no other associated symptoms of tinnitus, nasal congestion, headache, visual changes.  Patient states that occasionally he will feel off balance with standing or walking but this quickly resolves.  He has not tried any medications for same.  No history of allergic rhinitis.     Physical Exam   Triage Vital Signs: ED Triage Vitals  Enc Vitals Group     BP 06/28/22 1634 (!) 130/110     Pulse Rate 06/28/22 1634 85     Resp 06/28/22 1634 18     Temp 06/28/22 1633 98.4 F (36.9 C)     Temp src --      SpO2 06/28/22 1634 99 %     Weight 06/28/22 1634 250 lb (113.4 kg)     Height 06/28/22 1634 5\' 11"  (1.803 m)     Head Circumference --      Peak Flow --      Pain Score 06/28/22 1634 5     Pain Loc --      Pain Edu? --      Excl. in [place]? --     Most recent vital signs: Vitals:   06/28/22 1633 06/28/22 1634  BP:  (!) 130/110  Pulse:  85  Resp:  18  Temp: 98.4 F (36.9 C)   SpO2:  99%     General: Alert and in no acute distress. Head: No acute traumatic findings ENT:      Ears: EACs unremarkable bilaterally.  TM on left is bulging, TM on right is nonbulging.  No injection or erythema of either TM.  No tenderness over the mastoids.      Nose: No congestion/rhinnorhea.      Mouth/Throat: Mucous membranes are moist. Neck: No stridor. No cervical spine tenderness to palpation Hematological/Lymphatic/Immunilogical: No cervical lymphadenopathy. Cardiovascular:  Good peripheral perfusion Respiratory: Normal respiratory effort without tachypnea or retractions. Lungs CTAB.  Musculoskeletal: Full range of  motion to all extremities.  Neurologic:  No gross focal neurologic deficits are appreciated.  Skin:   No rash noted Other:   ED Results / Procedures / Treatments   Labs (all labs ordered are listed, but only abnormal results are displayed) Labs Reviewed - No data to display   EKG     RADIOLOGY    No results found.  PROCEDURES:  Critical Care performed: No  Procedures   MEDICATIONS ORDERED IN ED: Medications - No data to display   IMPRESSION / MDM / ASSESSMENT AND PLAN / ED COURSE  I reviewed the triage vital signs and the nursing notes.                                 Differential diagnosis includes, but is not limited to, otitis media, otitis externa, eustachian tube dysfunction, mastoiditis, allergic rhinitis, posterior auricular lymphadenopathy  Patient's presentation is most consistent with acute presentation with potential threat to life or bodily function.   Patient's diagnosis is consistent with eustachian tube dysfunction.  Patient presents emergency department with a popping sensation with  breathing and swallowing to the left ear.  Visualization of the ear reveals no signs of infection with EACs or TM.  Patient does have some bulging consistent with likely eustachian tube dysfunction.  Patient states that he will have some slight dizziness/vertigo-like symptoms with changing position which I also suspect is secondary to his eustachian tube dysfunction.  Cranial nerve testing was negative.  No other concerning signs and symptoms.  Will try Zyrtec and Flonase for symptomatic control.  Concerning signs and symptoms and return precautions discussed with the patient.  Follow-up primary care as needed.. Patient is given ED precautions to return to the ED for any worsening or new symptoms.     FINAL CLINICAL IMPRESSION(S) / ED DIAGNOSES   Final diagnoses:  Eustachian tube dysfunction, left     Rx / DC Orders   ED Discharge Orders          Ordered     cetirizine (ZYRTEC) 10 MG tablet  Daily        06/28/22 1701    fluticasone (FLONASE) 50 MCG/ACT nasal spray  2 times daily        06/28/22 1701             Note:  This document was prepared using Dragon voice recognition software and may include unintentional dictation errors.   Racheal Patches, PA-C 06/28/22 1704    Corena Herter, MD 06/28/22 2318

## 2022-07-19 ENCOUNTER — Ambulatory Visit
Admission: EM | Admit: 2022-07-19 | Discharge: 2022-07-19 | Disposition: A | Discharge status: Home / Self Care | Payer: 59 | Attending: Urgent Care | Admitting: Urgent Care

## 2022-07-19 DIAGNOSIS — L03011 Cellulitis of right finger: Secondary | ICD-10-CM

## 2022-07-19 NOTE — Discharge Instructions (Addendum)
  Replace dressing with band-aid starting tomorrow.

## 2022-07-19 NOTE — ED Triage Notes (Signed)
Pt states he smashed his finger with a vise-grip pliers a week and half ago. Now having swelling and pain to his right middle finger.

## 2022-07-19 NOTE — ED Provider Notes (Addendum)
Sean Roy    CSN: [id number] Arrival date & time: 07/19/22  1337      History   Chief Complaint Chief Complaint  Patient presents with   Finger Injury    HPI Sean Roy is a 67 y.o. male.   HPI  Presents to urgent care for evaluation and treatment following an injury to his right middle finger about 1.5 weeks ago.  He states he "smashed" his fingertip with a vice grip and now endorses pain and swelling to the right middle finger.  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 Authorizing Provider  amLODipine (NORVASC) 5 MG tablet Take 1 tablet (5 mg total) by mouth daily. 07/10/18  Yes Bacigalupo, Marzella Schlein, MD  atorvastatin (LIPITOR) 40 MG tablet Take 1 tablet (40 mg total) by mouth daily. 02/25/18  Yes Bacigalupo, Marzella Schlein, MD  glucose blood (ONE TOUCH ULTRA TEST) test strip Use 1 strip  once a day  [DX 250.00]   Yes [provider]  glyBURIDE-metformin (GLUCOVANCE) 2.5-500 MG tablet TAKE 2 TABLETS BY MOUTH TWO TIMES DAILY WITH MEALS 05/13/18  Yes Bacigalupo, Marzella Schlein, MD  ibuprofen (ADVIL) 600 MG tablet Take 1 tablet (600 mg total) by mouth every 6 (six) hours  as needed. 12/23/21  Yes Mickie Bail, NP  lisinopril (ZESTRIL) 40 MG tablet Take 1 tablet (40 mg total) by mouth daily. 07/11/18  Yes Bacigalupo, Marzella Schlein, MD  mometasone (ELOCON) 0.1 % cream Apply 1 Application topically as directed. Apply once to twice daily, avoid eye area 02/08/22  Yes Willeen Niece, MD  allopurinol (ZYLOPRIM) 100 MG tablet Take 1 tablet (100 mg total) by mouth daily. 08/28/21 09/27/21  [name], NP  cetirizine (ZYRTEC) 10 MG tablet Take 1 tablet (10 mg total) by mouth daily. 06/28/22   Cuthriell, Delorise Royals, PA-C  empagliflozin (JARDIANCE) 10 MG TABS tablet Take 10 mg by mouth daily. 05/06/18   Erasmo Downer, MD  fluticasone (FLONASE) 50 MCG/ACT nasal spray Place 1 spray into both nostrils 2 (two) times daily. 06/28/22   Cuthriell, Delorise Royals, PA-C  Roflumilast (ZORYVE) 0.3 % CREA Apply to affected areas once daily as needed for rash. 12/05/21   Willeen Niece, MD  tacrolimus (PROTOPIC) 0.1 % ointment Apply topically as directed. Apply once to twice daily to face as needed 02/08/22   Willeen Niece, MD  TRULICITY 1.5 MG/0.5ML SOPN INJECT SUBCUTANEOUSLY 1.5MG  ONCE A WEEK 03/26/19   Bacigalupo, Marzella Schlein, MD    Family History Family History  Problem Relation Age of Onset   Healthy Sister    Healthy Brother    Healthy Brother    Healthy Sister  Social History Social History   Tobacco Use   Smoking status: Never   Smokeless tobacco: Never  Vaping Use   Vaping Use: Never used  Substance Use Topics   Alcohol use: Yes    Alcohol/week: 3.0 standard drinks of alcohol    Types: 3 Shots of liquor per week    Comment: occasional   Drug use: No     Allergies   Patient has no known allergies.   Review of Systems Review of Systems   Physical Exam Triage Vital Signs ED Triage Vitals  Enc Vitals Group     BP 07/19/22 1352 139/88     Pulse Rate 07/19/22 1352 79     Resp 07/19/22 1352 18     Temp 07/19/22 1352 98.7 F (37.1 C)     Temp Source 07/19/22  1352 Oral     SpO2 07/19/22 1352 97 %     Weight --      Height --      Head Circumference --      Peak Flow --      Pain Score 07/19/22 1356 6     Pain Loc --      Pain Edu? --      Excl. in GC? --    No data found.  Updated Vital Signs BP 139/88 (BP Location: Left Arm)   Pulse 79   Temp 98.7 F (37.1 C) (Oral)   Resp 18   SpO2 97%   Visual Acuity Right Eye Distance:   Left Eye Distance:   Bilateral Distance:    Right Eye Near:   Left Eye Near:    Bilateral Near:     Physical Exam Vitals reviewed.  Constitutional:      Appearance: Normal appearance.  Musculoskeletal:       Hands:  Skin:    General: Skin is warm and dry.  Neurological:     General: No focal deficit present.     Mental Status: He is alert and oriented to person, place, and time.  Psychiatric:        Mood and Affect: Mood normal.        Behavior: Behavior normal.      UC Treatments / Results  Labs (all labs ordered are listed, but only abnormal results are displayed) Labs Reviewed - No data to display  EKG   Radiology No results found.  Procedures Incision and Drainage  Date/Time: 07/19/2022 2:18 PM  Performed by: Charma Igo, FNP Authorized by: Charma Igo, FNP   Consent:    Consent obtained:  Verbal   Consent given by:  Patient   Risks discussed:  Incomplete drainage and pain   Alternatives discussed:  No treatment Universal protocol:    Procedure explained and questions answered to patient or proxy's satisfaction: yes     Relevant documents present and verified: yes     Test results available : no     Imaging studies available: no     Required blood products, implants, devices, and special equipment available: no     Site/side marked: no     Immediately prior to procedure, a time out was called: yes     Patient identity confirmed:  Verbally with patient Location:    Type:  Abscess   Size:  1 cm   Location:  Upper extremity   Upper extremity location:   Finger   Finger location:  R long finger Pre-procedure details:    Skin preparation:  Povidone-iodine Sedation:  Sedation type:  None Anesthesia:    Anesthesia method:  Nerve block   Block location:  Total   Block needle gauge:  25 G   Block anesthetic:  Lidocaine 1% w/o epi   Block injection procedure:  Anatomic landmarks identified   Block outcome:  Anesthesia achieved Procedure type:    Complexity:  Simple Procedure details:    Incision types:  Stab incision   Incision depth:  Dermal   Drainage:  Purulent   Drainage amount:  Moderate   Wound treatment:  Wound left open   Packing materials:  None Post-procedure details:    Procedure completion:  Tolerated  (including critical care time)  Medications Ordered in UC Medications - No data to display  Initial Impression / Assessment and Plan / UC Course  I have reviewed the triage vital signs and the nursing notes.  Pertinent labs & imaging results that were available during my care of the patient were reviewed by me and considered in my medical decision making (see chart for details).   Paronychia of the middle finger of the right hand following an injury.  See procedure note.   Final Clinical Impressions(s) / UC Diagnoses   Final diagnoses:  None   Discharge Instructions   None    ED Prescriptions   None    PDMP not reviewed this encounter.   Charma Igo, FNP 07/19/22 1420    ImmordinoJeannett Senior, FNP 07/19/22 1434

## 2022-07-31 ENCOUNTER — Emergency Department
Admission: EM | Admit: 2022-07-31 | Discharge: 2022-07-31 | Disposition: A | Discharge status: Home / Self Care | Payer: 59 | Attending: Emergency Medicine | Admitting: Emergency Medicine

## 2022-07-31 ENCOUNTER — Emergency Department: Payer: 59

## 2022-07-31 DIAGNOSIS — R079 Chest pain, unspecified: Secondary | ICD-10-CM | POA: Diagnosis present

## 2022-07-31 DIAGNOSIS — Z7901 Long term (current) use of anticoagulants: Secondary | ICD-10-CM | POA: Insufficient documentation

## 2022-07-31 DIAGNOSIS — I4891 Unspecified atrial fibrillation: Secondary | ICD-10-CM

## 2022-07-31 LAB — CBC WITH DIFFERENTIAL/PLATELET
Abs Immature Granulocytes: 0.02 10*3/uL (ref 0.00–0.07)
Basophils Absolute: 0 10*3/uL (ref 0.0–0.1)
Basophils Relative: 1 %
Eosinophils Absolute: 0.1 10*3/uL (ref 0.0–0.5)
Eosinophils Relative: 1 %
HCT: 42.8 % (ref 39.0–52.0)
Hemoglobin: 13.2 g/dL (ref 13.0–17.0)
Immature Granulocytes: 0 %
Lymphocytes Relative: 36 %
Lymphs Abs: 2.4 10*3/uL (ref 0.7–4.0)
MCH: 25.4 pg — ABNORMAL LOW (ref 26.0–34.0)
MCHC: 30.8 g/dL (ref 30.0–36.0)
MCV: 82.3 fL (ref 80.0–100.0)
Monocytes Absolute: 0.4 10*3/uL (ref 0.1–1.0)
Monocytes Relative: 6 %
Neutro Abs: 3.8 10*3/uL (ref 1.7–7.7)
Neutrophils Relative %: 56 %
Platelets: 215 10*3/uL (ref 150–400)
RBC: 5.2 MIL/uL (ref 4.22–5.81)
RDW: 14.1 % (ref 11.5–15.5)
WBC: 6.7 10*3/uL (ref 4.0–10.5)
nRBC: 0 % (ref 0.0–0.2)

## 2022-07-31 LAB — COMPREHENSIVE METABOLIC PANEL
ALT: 40 U/L (ref 0–44)
AST: 29 U/L (ref 15–41)
Albumin: 3.3 g/dL — ABNORMAL LOW (ref 3.5–5.0)
Alkaline Phosphatase: 63 U/L (ref 38–126)
Anion gap: 7 (ref 5–15)
BUN: 24 mg/dL — ABNORMAL HIGH (ref 8–23)
CO2: 25 mmol/L (ref 22–32)
Calcium: 8.3 mg/dL — ABNORMAL LOW (ref 8.9–10.3)
Chloride: 103 mmol/L (ref 98–111)
Creatinine, Ser: 1.57 mg/dL — ABNORMAL HIGH (ref 0.61–1.24)
GFR, Estimated: 49 mL/min — ABNORMAL LOW (ref >60)
Glucose, Bld: 351 mg/dL — ABNORMAL HIGH (ref 70–99)
Potassium: 4.7 mmol/L (ref 3.5–5.1)
Sodium: 135 mmol/L (ref 135–145)
Total Bilirubin: 0.7 mg/dL (ref 0.3–1.2)
Total Protein: 6.5 g/dL (ref 6.5–8.1)

## 2022-07-31 LAB — MAGNESIUM: Magnesium: 2.3 mg/dL (ref 1.7–2.4)

## 2022-07-31 LAB — TROPONIN I (HIGH SENSITIVITY): Troponin I (High Sensitivity): 11 ng/L (ref <18)

## 2022-07-31 LAB — BRAIN NATRIURETIC PEPTIDE: B Natriuretic Peptide: 17.1 pg/mL (ref 0.0–100.0)

## 2022-07-31 MED ORDER — DILTIAZEM HCL ER COATED BEADS 120 MG PO CP24
120.0000 mg | ORAL_CAPSULE | Freq: Once | ORAL | Status: AC
  Administered 2022-07-31: 120 mg via ORAL
  Filled 2022-07-31: qty 1

## 2022-07-31 MED ORDER — APIXABAN 5 MG PO TABS: 5.0000 mg | ORAL_TABLET | Freq: Two times a day (BID) | ORAL | 1 refills | Status: AC

## 2022-07-31 MED ORDER — APIXABAN 5 MG PO TABS
5.0000 mg | ORAL_TABLET | Freq: Once | ORAL | Status: AC
  Administered 2022-07-31: 5 mg via ORAL
  Filled 2022-07-31: qty 1

## 2022-07-31 MED ORDER — DILTIAZEM HCL 60 MG PO TABS
60.0000 mg | ORAL_TABLET | Freq: Once | ORAL | Status: AC
  Administered 2022-07-31: 60 mg via ORAL
  Filled 2022-07-31: qty 1

## 2022-07-31 MED ORDER — DILTIAZEM HCL 25 MG/5ML IV SOLN
15.0000 mg | Freq: Once | INTRAVENOUS | Status: AC
  Administered 2022-07-31: 15 mg via INTRAVENOUS
  Filled 2022-07-31: qty 5

## 2022-07-31 MED ORDER — DILTIAZEM HCL 25 MG/5ML IV SOLN: 15.0000 mg | Freq: Once | INTRAVENOUS | Status: DC

## 2022-07-31 MED ORDER — DILTIAZEM HCL ER COATED BEADS 180 MG PO CP24: 180.0000 mg | ORAL_CAPSULE | Freq: Every day | ORAL | 0 refills | Status: AC

## 2022-07-31 MED ORDER — SODIUM CHLORIDE 0.9 % IV BOLUS
500.0000 mL | Freq: Once | INTRAVENOUS | Status: AC
  Administered 2022-07-31: 500 mL via INTRAVENOUS

## 2022-07-31 NOTE — ED Provider Notes (Addendum)
[facility] Provider Note    Event Date/Time   First MD Initiated Contact with Patient 07/31/22 1505     (approximate)   History   Chest Pain (/) and Tachycardia   HPI  Sean Roy is a 67 y.o. male here with chest pain and tachycardia.  The patient states that earlier today he developed acute onset of palpitations and feeling like his heart was beating very quickly.  He says he has had somewhat similar episodes several times in the past, and they all resolved.  They often occurred in the setting of drinking juice.  Denies any actual chest pain.  He states he now feels mostly back to himself.  Denies any other complaints.  Denies history of arrhythmias.     Physical Exam   Triage Vital Signs: ED Triage Vitals [07/31/22 1443]  Enc Vitals Group     BP      Pulse      Resp      Temp      Temp src      SpO2 96 %     Weight      Height      Head Circumference      Peak Flow      Pain Score 5     Pain Loc      Pain Edu?      Excl. in GC?     Most recent vital signs: Vitals:   07/31/22 1443  SpO2: 96%     General: Awake, no distress.  CV:  Good peripheral perfusion.  Tachycardic, irregular. Resp:  Normal work of breathing.  Lungs clear to auscultation bilaterally. Abd:  No distention.  No tenderness. Other:  No lower extremity edema.   ED Results / Procedures / Treatments   Labs (all labs ordered are listed, but only abnormal results are displayed) Labs Reviewed  CBC WITH DIFFERENTIAL/PLATELET - Abnormal; Notable for the following components:      Result Value   MCH 25.4 (*)    All other components within normal limits  COMPREHENSIVE METABOLIC PANEL - Abnormal; Notable for the following components:   Glucose, Bld 351 (*)    BUN 24 (*)    Creatinine, Ser 1.57 (*)    Calcium 8.3 (*)    Albumin 3.3 (*)    GFR, Estimated 49 (*)    All other components within normal limits  BRAIN NATRIURETIC PEPTIDE  MAGNESIUM  TROPONIN I  (HIGH SENSITIVITY)     EKG Atrial fibrillation with rapid ventricular response, ventricular 150.  QRS 72, QTc 479.  No acute ST elevation or depression or acute evidence of acute ischemia or infarct.   RADIOLOGY Chest x-ray: No active disease   I also independently reviewed and agree with radiologist interpretations.   PROCEDURES:  Critical Care performed: Yes, see critical care procedure note(s)  .1-3 Lead EKG Interpretation  Performed by: Shaune Pollack, MD Authorized by: Shaune Pollack, MD     Interpretation: abnormal     ECG rate:  100-130s   ECG rate assessment: tachycardic     Rhythm: atrial fibrillation     Ectopy: none     Conduction: normal   Comments:     Indication: New onset AFIb, SOB     MEDICATIONS ORDERED IN ED: Medications  diltiazem (CARDIZEM) injection 15 mg (15 mg Intravenous Given 07/31/22 1535)  apixaban (ELIQUIS) tablet 5 mg (5 mg Oral Given 07/31/22 1616)  diltiazem (CARDIZEM) tablet 60 mg (60 mg Oral  Given 07/31/22 1616)  sodium chloride 0.9 % bolus 500 mL (0 mLs Intravenous Stopped 07/31/22 1736)  diltiazem (CARDIZEM CD) 24 hr capsule 120 mg (120 mg Oral Given 07/31/22 1725)     IMPRESSION / MDM / ASSESSMENT AND PLAN / ED COURSE  I reviewed the triage vital signs and the nursing notes.                              Differential diagnosis includes, but is not limited to, new onset A-fib, ACS, anemia, electrolyte abnormality, atypical chest pain, GERD/gastritis  Patient's presentation is most consistent with acute presentation with potential threat to life or bodily function.  The patient is on the cardiac monitor to evaluate for evidence of arrhythmia and/or significant heart rate changes   67 year old male here with chest pain and palpitations, now mostly improved.  Patient arrives in what appears to be new onset atrial fibrillation with rapid ventricular response.  Denies any significant chest pain with this currently.  Lab work reviewed  and is overall reassuring.  No major leukocytosis or anemia.  CMP shows possible slight dehydration he was given cautious fluids.  Troponin negative.  Magnesium normal.  Chest x-ray is clear.  Patient given IV as well as p.o. diltiazem and a dose of Eliquis with resolution of his A-fib.  He remains in normal sinus rhythm.  He is asymptomatic now.  Given resolution of his atrial fibrillation along with otherwise stable appearance and reassuring labs, will treat as an outpatient with addition of Eliquis given his elevated CHADS2 VASC score of 3, and have him follow-up with his cardiologist. He will d/c his amlodipine while starting the dilt. Discussed risks/benefits and caution sx with blood thinners at bedside.  FINAL CLINICAL IMPRESSION(S) / ED DIAGNOSES   Final diagnoses:  New onset atrial fibrillation (HCC)     Rx / DC Orders   ED Discharge Orders          Ordered    apixaban (ELIQUIS) 5 MG TABS tablet  2 times daily        07/31/22 1750    diltiazem (CARDIZEM CD) 180 MG 24 hr capsule  Daily        07/31/22 1750             Note:  This document was prepared using Dragon voice recognition software and may include unintentional dictation errors.   Shaune Pollack, MD 07/31/22 1754    Shaune Pollack, MD 07/31/22 1755

## 2022-07-31 NOTE — Discharge Instructions (Addendum)
STOP taking any aspirin, ibuprofen, naproxen, alleve, or other NSAID medications as they will interact with your new blood thinner  STOP taking the Amlodipine medication for blood pressure,as we are replacing it with the NEW medication diltiazem  START taking Diltiazem 180 mg daily. This can be taken starting tomorrow morning. This will help control your blood pressure and heart rate.  START taking the Eliquis blood thinner. Review the information in this discharge and the Eliquis prescription for information about blood thinners.  CALL Dr. Juliann Pares to discuss your ER visit and to arrange follow-up this week

## 2022-07-31 NOTE — ED Triage Notes (Signed)
Pt to ED via EMS from work following sudden onset of chest pain. Pt is in Afib (new onset) and tachycardic upon arrival. Pt denies SOB and reports some resolution of chest pain. Pt was given 10 mg Cardizem by EMS which brought HR down to 112.   Hx: HTN, diabetes CBG: 390

## 2022-08-01 NOTE — ED Notes (Signed)
First nurse note: Pt to first nurse desk requesting a work note to go back to work, pt educated that normally if DC papers are provided and there as no initial work note given then pt can return to work. Dr. Cyril Loosen gave pt a note to return to work today. Pt in NAD. Pt ambulatory with steady gait.

## 2022-08-15 ENCOUNTER — Other Ambulatory Visit: Payer: Self-pay | Admitting: Internal Medicine

## 2022-08-15 DIAGNOSIS — I25810 Atherosclerosis of coronary artery bypass graft(s) without angina pectoris: Secondary | ICD-10-CM

## 2022-08-16 ENCOUNTER — Ambulatory Visit
Admission: RE | Admit: 2022-08-16 | Discharge: 2022-08-16 | Disposition: A | Discharge status: Home / Self Care | Payer: 59 | Source: Ambulatory Visit | Attending: Internal Medicine | Admitting: Internal Medicine

## 2022-08-16 DIAGNOSIS — I25810 Atherosclerosis of coronary artery bypass graft(s) without angina pectoris: Secondary | ICD-10-CM | POA: Insufficient documentation

## 2022-08-18 ENCOUNTER — Other Ambulatory Visit: Payer: Self-pay | Admitting: Internal Medicine

## 2022-08-18 DIAGNOSIS — R931 Abnormal findings on diagnostic imaging of heart and coronary circulation: Secondary | ICD-10-CM

## 2022-09-12 ENCOUNTER — Telehealth (HOSPITAL_COMMUNITY): Payer: Self-pay | Admitting: *Deleted

## 2022-09-12 NOTE — Telephone Encounter (Signed)
Attempted to call patient regarding upcoming cardiac CT appointment. °Left message on voicemail with name and callback number ° °Orel Hord RN Navigator Cardiac Imaging °[facility] °[phone] Office °[phone] Cell ° °

## 2022-09-13 ENCOUNTER — Telehealth (HOSPITAL_COMMUNITY): Payer: Self-pay | Admitting: *Deleted

## 2022-09-13 NOTE — Telephone Encounter (Signed)
Reaching out to patient to offer assistance regarding upcoming cardiac imaging study; pt verbalizes understanding of appt date/time, parking situation and where to check in, pre-test NPO status and verified current allergies; name and call back number provided for further questions should they arise  Larey Brick RN Navigator Cardiac Imaging [name] [phone] office [phone] cell  Patient reports resting HR to be 59 while on phone. He will take his daily medications.

## 2022-09-14 ENCOUNTER — Ambulatory Visit
Admission: RE | Admit: 2022-09-14 | Discharge: 2022-09-14 | Disposition: A | Discharge status: Home / Self Care | Payer: 59 | Source: Ambulatory Visit | Attending: Internal Medicine | Admitting: Internal Medicine

## 2022-09-14 DIAGNOSIS — I2510 Atherosclerotic heart disease of native coronary artery without angina pectoris: Secondary | ICD-10-CM | POA: Insufficient documentation

## 2022-09-14 DIAGNOSIS — R931 Abnormal findings on diagnostic imaging of heart and coronary circulation: Secondary | ICD-10-CM | POA: Insufficient documentation

## 2022-09-14 LAB — POCT I-STAT CREATININE: Creatinine, Ser: 1 mg/dL (ref 0.61–1.24)

## 2022-09-14 MED ORDER — IOHEXOL 350 MG/ML SOLN
100.0000 mL | Freq: Once | INTRAVENOUS | Status: AC | PRN
  Administered 2022-09-14: 100 mL via INTRAVENOUS

## 2022-09-14 MED ORDER — SODIUM CHLORIDE 0.9 % IV SOLN: INTRAVENOUS | Status: DC

## 2022-09-14 MED ORDER — DILTIAZEM HCL 25 MG/5ML IV SOLN: 10.0000 mg | Freq: Once | INTRAVENOUS | Status: DC

## 2022-09-14 MED ORDER — NITROGLYCERIN 0.4 MG SL SUBL
0.8000 mg | SUBLINGUAL_TABLET | Freq: Once | SUBLINGUAL | Status: AC
  Administered 2022-09-14: 0.8 mg via SUBLINGUAL

## 2022-09-14 NOTE — Progress Notes (Signed)
Patient tolerated CT well. Drank water after. Vital signs stable encourage to drink water throughout day.Reasons explained and verbalized understanding. Ambulated steady gait.  

## 2022-09-14 NOTE — Progress Notes (Signed)
Patient tolerated procedure well. Ambulate w/o difficulty. Denies any lightheadedness or being dizzy. Pt denies any pain at this time. Sitting in chair, drinking water provided. P is encouraged to drink additional water throughout the day and reason explained to patient. Patient verbalized understanding and all questions answered. ABC intact. No further needs at this time. Discharge from procedure area w/o issues.  °

## 2022-12-14 IMAGING — DX DG FOOT COMPLETE 3+V*R*
3 series · 3 of 3 positions shown · non-contrast
Comparison: None Available.

CLINICAL DATA: Right foot pain, gout

EXAM:
RIGHT FOOT COMPLETE - 3+ VIEW

[foot ap]
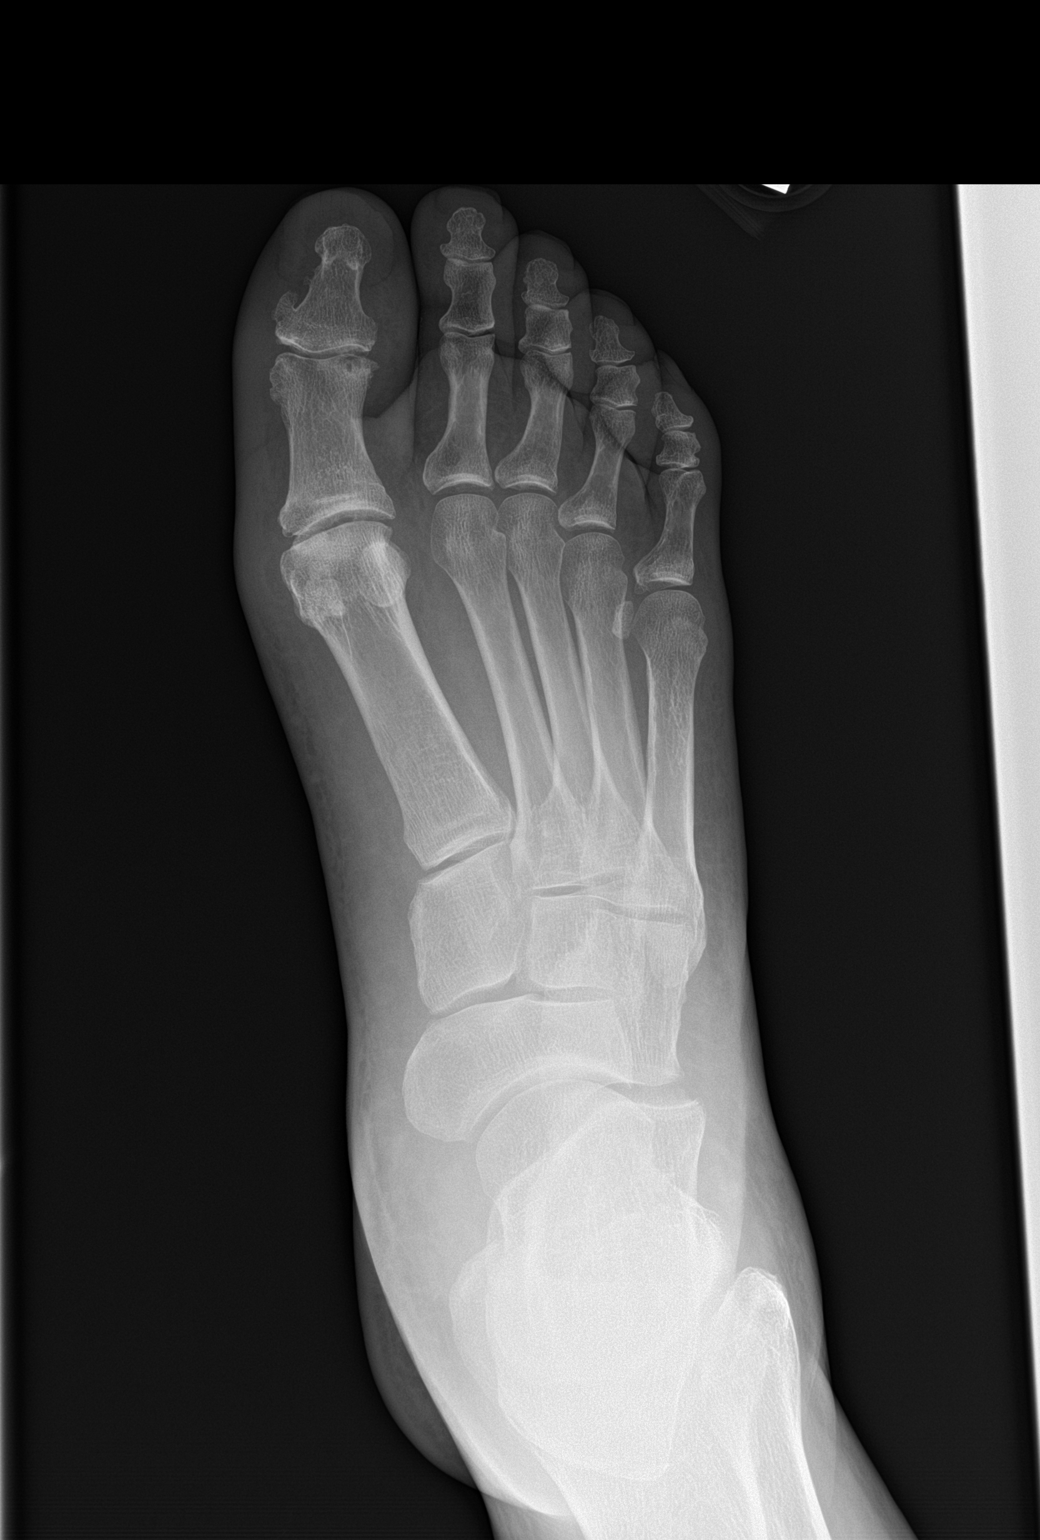

[foot lat]
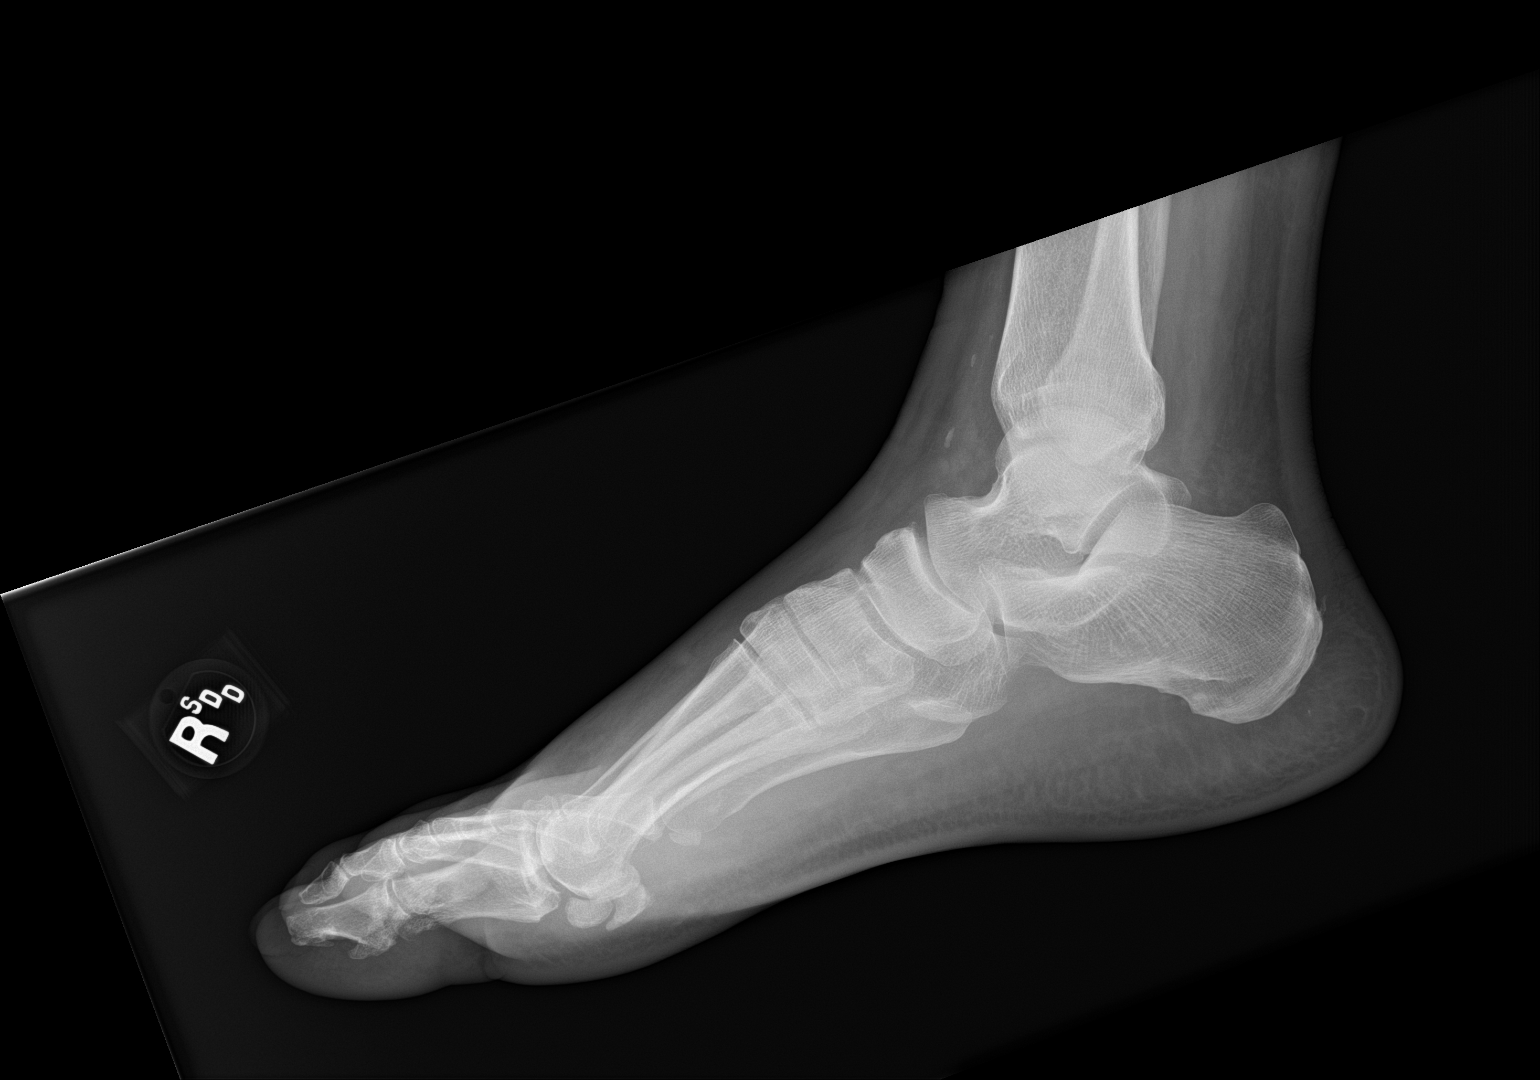

[foot obl]
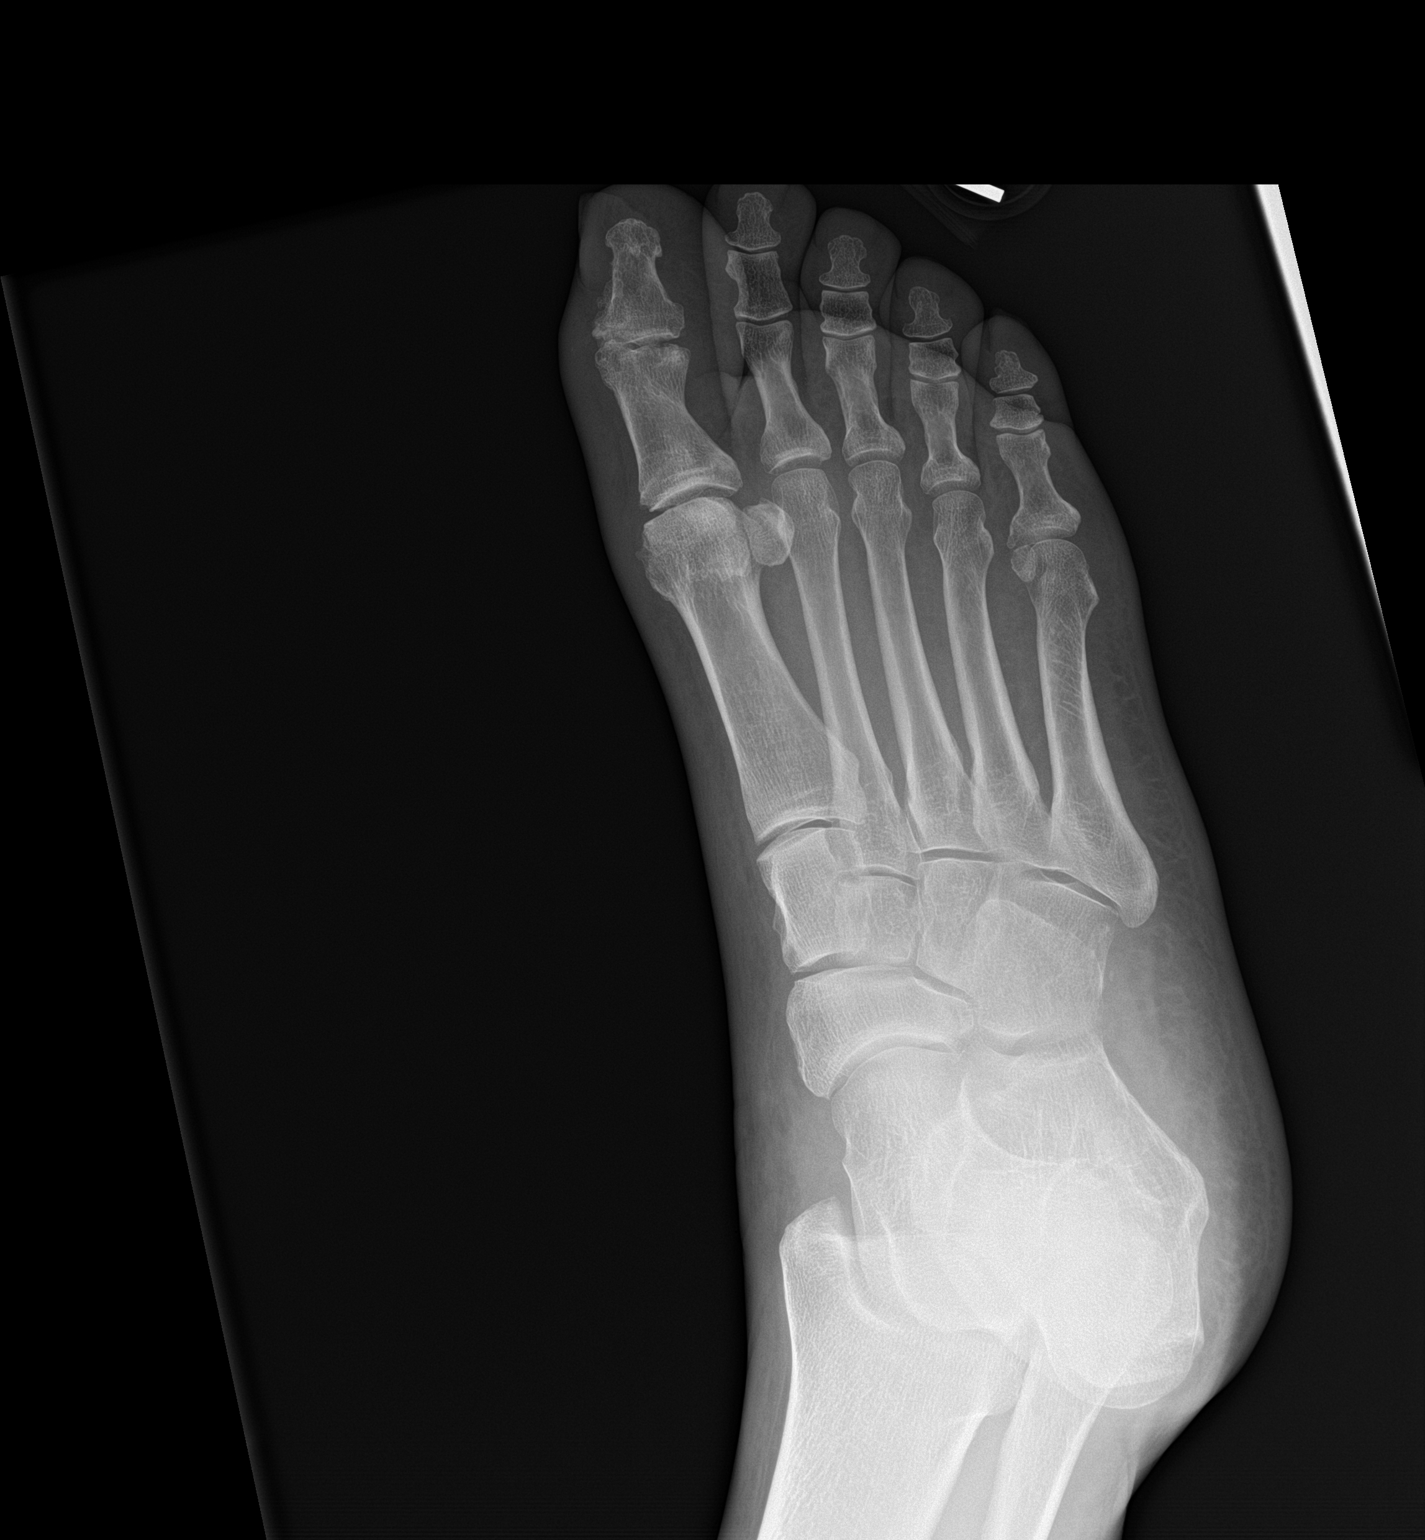

[3 of 3 positions shown; findings below may reference images not displayed]

FINDINGS: Normal alignment. No acute fracture or dislocation. Mild
degenerative arthritis of the interphalangeal joint of the right
great toe. There is mild soft tissue swelling of the right forefoot.
Vascular calcifications noted within the right foot.
IMPRESSION: Mild soft tissue swelling. No acute fracture or dislocation.

## 2022-12-20 ENCOUNTER — Ambulatory Visit
Admission: EM | Admit: 2022-12-20 | Discharge: 2022-12-20 | Disposition: A | Discharge status: Home / Self Care | Payer: 59 | Attending: Emergency Medicine | Admitting: Emergency Medicine

## 2022-12-20 DIAGNOSIS — R519 Headache, unspecified: Secondary | ICD-10-CM

## 2022-12-20 MED ORDER — PREDNISONE 10 MG (21) PO TBPK: ORAL_TABLET | Freq: Every day | ORAL | 0 refills | Status: DC

## 2022-12-20 MED ORDER — AMOXICILLIN-POT CLAVULANATE 875-125 MG PO TABS: 1.0000 | ORAL_TABLET | Freq: Two times a day (BID) | ORAL | 0 refills | Status: DC

## 2022-12-20 NOTE — ED Triage Notes (Signed)
Patient presents to UC for scalp infection x 2 weeks. No OTC med, not taking anything for pain.

## 2022-12-20 NOTE — Discharge Instructions (Addendum)
ON Exam there are no signs of infection to your scalp such as redness, swelling or lesions, cyst or abscess  In May when you are evaluated by your ear nose and throat doctor you were given an antibiotic as well as steroids at this time we will hold off on use of antibiotics but will attempt use of steroids as you do find some relief with medication  Begin Augmentin every morning and every evening for 7 days  Begin use of prednisone as directed to reduce inflammation and help with pain, you may take Tylenol in addition to this medicine as needed  You may hold warm compresses to the scalp in 10 to 15-minute intervals  You may schedule follow-up appointment with your throat doctor once your insurance is straightened out for sometime next week for reevaluation of your symptoms, they may order imaging as you know all discussed in May  I only recommend that you go to the emergency department for imaging if you are significantly significantly worsen or you begin to experience dizziness lightheadedness, severe headache, worsening blurred vision etc.

## 2022-12-20 NOTE — ED Provider Notes (Signed)
[name]    CSN: [id number] Arrival date & time: 12/20/22  1224      History   Chief Complaint Chief Complaint  Patient presents with   Rash    HPI Sean Roy is a 67 y.o. male.   Patient presents for evaluation of pain to the scalp behind the left ear occurring intermittently for 1 to 2 weeks.  Intermittently radiating to the anterior of the scalp.  Started abruptly without precipitating event, injury or trauma.  Has not attempted treatment.  Has occurred before with last occurrence in May 2024.  At that time was evaluated by ear nose and throat who checked the ears which she deems stable and then checked in the nose and sinuses and noted infection, started on antibiotic and steroid course, took medicine for 3 to 4 days and symptoms resolved and Sean Roy discontinued use.  Denies headache, dizziness, lightheadedness, visual changes worse than baseline, numbness, tingling.  Blood pressure 167/93, heart rate 77, O2 saturation 98, temperature 97.8, respirations 21  Past Medical History:  Diagnosis Date   Diabetes mellitus without complication (HCC)    Hyperlipidemia    Hypertension     Patient Active Problem List   Diagnosis Date Noted   Arthritis of right knee 02/23/2022   Trapezius muscle spasm 01/05/2018   Chronic gouty arthropathy without tophi 07/27/2015   Absolute anemia 01/13/2015   Morbid obesity (HCC) 01/13/2015   Elevated CK 01/13/2015   Gastric ulcer 01/13/2015   HLD (hyperlipidemia) 01/13/2015   Cannot sleep 01/13/2015   Back strain 01/13/2015   Arthritis, degenerative 01/13/2015   Essential (primary) hypertension 12/21/2014   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 Authorizing Provider  amoxicillin-clavulanate (AUGMENTIN) 875-125 MG tablet Take 1 tablet by mouth every 12 (twelve) hours. 12/20/22  Yes Keleigh Kazee R, NP  predniSONE (STERAPRED UNI-PAK 21 TAB) 10 MG (21) TBPK tablet Take by mouth daily. Take 6 tabs by mouth daily  for 1 days, then 5 tabs for 1 days, then 4 tabs for 1 days, then 3 tabs for 1 days, 2 tabs for 1 days, then 1 tab by mouth daily for 1 days 12/20/22  Yes Singleton Hickox R, NP  allopurinol (ZYLOPRIM) 100 MG tablet Take 1 tablet (100 mg total) by mouth daily. 08/28/21 09/27/21  [name], NP  apixaban (ELIQUIS) 5 MG TABS tablet Take 1 tablet (5 mg total) by mouth 2 (two) times daily. 07/31/22 09/29/22  Shaune Pollack, MD  atorvastatin (LIPITOR) 40 MG tablet Take 1 tablet (40 mg total) by mouth daily. 02/25/18   Erasmo Downer, MD  cetirizine (ZYRTEC) 10 MG tablet Take 1 tablet (10 mg total) by mouth daily. 06/28/22   Cuthriell, Delorise Royals, PA-C  diltiazem (CARDIZEM CD) 180 MG 24 hr capsule Take 1 capsule (180 mg total) by mouth daily. 07/31/22 09/29/22  Shaune Pollack, MD  empagliflozin (JARDIANCE) 10 MG TABS tablet Take 10 mg by mouth daily. 05/06/18   Erasmo Downer, MD  fluticasone (FLONASE) 50 MCG/ACT nasal spray Place 1 spray into both nostrils 2 (two) times daily. 06/28/22   Cuthriell, Delorise Royals, PA-C  glucose blood (ONE TOUCH ULTRA TEST) test strip Use 1 strip  once a day  [DX 250.00]    [provider]  glyBURIDE-metformin (GLUCOVANCE) 2.5-500 MG tablet TAKE 2 TABLETS BY MOUTH TWO TIMES DAILY WITH MEALS 05/13/18   Bacigalupo, Marzella Schlein, MD  lisinopril (ZESTRIL) 40 MG tablet Take 1 tablet (40 mg total) by mouth daily. 07/11/18   Erasmo Downer, MD  mometasone (ELOCON) 0.1 % cream Apply 1 Application topically as directed. Apply once to twice daily, avoid eye area 02/08/22   Willeen Niece, MD  Roflumilast (ZORYVE) 0.3 %  CREA Apply to affected areas once daily as needed for rash. 12/05/21   Willeen Niece, MD  tacrolimus (PROTOPIC) 0.1 % ointment Apply topically as directed. Apply once to twice daily to face as needed 02/08/22   Willeen Niece, MD  TRULICITY 1.5 MG/0.5ML SOPN INJECT SUBCUTANEOUSLY 1.5MG  ONCE A WEEK 03/26/19   Bacigalupo, Marzella Schlein, MD    Family History Family History  Problem Relation Age of Onset   Healthy Sister    Healthy Brother    Healthy Brother    Healthy Sister     Social History Social History   Tobacco Use   Smoking status: Never   Smokeless tobacco: Never  Vaping Use   Vaping status: Never Used  Substance Use Topics   Alcohol use: Yes    Alcohol/week: 3.0 standard drinks of alcohol    Types: 3 Shots of liquor per week    Comment: occasional   Drug use: No     Allergies   Patient has no known allergies.   Review of Systems Review of Systems  Skin:  Positive for rash.     Physical Exam Triage Vital Signs ED Triage Vitals [12/20/22 1245]  Encounter Vitals Group     BP      Systolic BP Percentile      Diastolic BP Percentile      Pulse      Resp      Temp      Temp src      SpO2      Weight      Height      Head Circumference      Peak Flow      Pain Score 2     Pain Loc      Pain Education      Exclude from Growth Chart    No data found.  Updated Vital Signs There were no vitals taken for this visit.  Visual Acuity Right Eye Distance:   Left Eye Distance:   Bilateral Distance:    Right Eye Near:   Left Eye Near:    Bilateral Near:     Physical Exam Constitutional:      Appearance: Normal appearance.  HENT:     Head:      Comments: Mild tenderness noted to the left parietal region behind the ear without ecchymosis, swelling, lesion, cyst or abscess Eyes:     Extraocular Movements: Extraocular movements intact.  Pulmonary:     Effort: Pulmonary effort is normal.  Neurological:     Mental Status: Sean Roy is alert and oriented to  person, place, and time.      UC Treatments / Results  Labs (all labs ordered are listed, but only abnormal results are displayed) Labs Reviewed - No data to display  EKG   Radiology No results found.  Procedures Procedures (including critical care time)  Medications Ordered in UC Medications -  No data to display  Initial Impression / Assessment and Plan / UC Course  I have reviewed the triage vital signs and the nursing notes.  Pertinent labs & imaging results that were available during my care of the patient were reviewed by me and considered in my medical decision making (see chart for details).  Pain of scalp  Vital signs are stable, patient is in no signs of distress nontoxic-appearing, on exam there is tenderness to the scalp but no further abnormality, no neurological deficits noted or endorsed, stable for outpatient management, symptoms in the past have resolved with use of antibiotics and steroids, confirmed with [place] nose and throat via telephone will attempt use at this time, discussed with this patient, patient concerned with more serious cause and endorses that Sean Roy did discuss with the specialist imaging, reviewed that Sean Roy would need CT which is unavailable here in urgent care setting, verbalized understanding, recommended follow-up with ear nose and throat after completion of medicine and if unable to be seen to follow-up with his PCP, at this time does not need emergency department referral and only recommended if symptoms worsen or Sean Roy begins to have neurological deficits Final Clinical Impressions(s) / UC Diagnoses   Final diagnoses:  Pain of scalp     Discharge Instructions      ON Exam there are no signs of infection to your scalp such as redness, swelling or lesions, cyst or abscess  In May when you are evaluated by your ear nose and throat doctor you were given an antibiotic as well as steroids at this time we will hold off on use of antibiotics but  will attempt use of steroids as you do find some relief with medication  Begin Augmentin every morning and every evening for 7 days  Begin use of prednisone as directed to reduce inflammation and help with pain, you may take Tylenol in addition to this medicine as needed  You may hold warm compresses to the scalp in 10 to 15-minute intervals  You may schedule follow-up appointment with your throat doctor once your insurance is straightened out for sometime next week for reevaluation of your symptoms, they may order imaging as you know all discussed in May  I only recommend that you go to the emergency department for imaging if you are significantly significantly worsen or you begin to experience dizziness lightheadedness, severe headache, worsening blurred vision etc.   ED Prescriptions     Medication Sig Dispense Auth. Provider   amoxicillin-clavulanate (AUGMENTIN) 875-125 MG tablet Take 1 tablet by mouth every 12 (twelve) hours. 14 tablet Emily Massar R, NP   predniSONE (STERAPRED UNI-PAK 21 TAB) 10 MG (21) TBPK tablet Take by mouth daily. Take 6 tabs by mouth daily  for 1 days, then 5 tabs for 1 days, then 4 tabs for 1 days, then 3 tabs for 1 days, 2 tabs for 1 days, then 1 tab by mouth daily for 1 days 21 tablet Duane Earnshaw, Elita Boone, NP      PDMP not reviewed this encounter.   Valinda Hoar, NP 12/20/22 1404

## 2023-01-18 ENCOUNTER — Encounter: Payer: Self-pay | Admitting: Unknown Physician Specialty

## 2023-01-18 ENCOUNTER — Other Ambulatory Visit: Payer: Self-pay | Admitting: Unknown Physician Specialty

## 2023-01-18 DIAGNOSIS — H9202 Otalgia, left ear: Secondary | ICD-10-CM

## 2023-01-18 DIAGNOSIS — J019 Acute sinusitis, unspecified: Secondary | ICD-10-CM

## 2023-01-22 ENCOUNTER — Ambulatory Visit
Admission: RE | Admit: 2023-01-22 | Discharge: 2023-01-22 | Disposition: A | Discharge status: Home / Self Care | Payer: 59 | Source: Ambulatory Visit | Attending: Unknown Physician Specialty | Admitting: Unknown Physician Specialty

## 2023-01-22 DIAGNOSIS — H9202 Otalgia, left ear: Secondary | ICD-10-CM

## 2023-01-22 DIAGNOSIS — J019 Acute sinusitis, unspecified: Secondary | ICD-10-CM

## 2023-01-22 MED ORDER — IOPAMIDOL (ISOVUE-300) INJECTION 61%
75.0000 mL | Freq: Once | INTRAVENOUS | Status: AC | PRN
  Administered 2023-01-22: 75 mL via INTRAVENOUS

## 2023-04-02 ENCOUNTER — Other Ambulatory Visit: Payer: Self-pay

## 2023-04-02 DIAGNOSIS — E1165 Type 2 diabetes mellitus with hyperglycemia: Secondary | ICD-10-CM | POA: Insufficient documentation

## 2023-04-02 DIAGNOSIS — Z7901 Long term (current) use of anticoagulants: Secondary | ICD-10-CM | POA: Diagnosis not present

## 2023-04-02 DIAGNOSIS — K922 Gastrointestinal hemorrhage, unspecified: Secondary | ICD-10-CM | POA: Diagnosis not present

## 2023-04-02 DIAGNOSIS — I10 Essential (primary) hypertension: Secondary | ICD-10-CM | POA: Insufficient documentation

## 2023-04-02 DIAGNOSIS — Z7985 Long-term (current) use of injectable non-insulin antidiabetic drugs: Secondary | ICD-10-CM | POA: Diagnosis not present

## 2023-04-02 DIAGNOSIS — Z79899 Other long term (current) drug therapy: Secondary | ICD-10-CM | POA: Insufficient documentation

## 2023-04-02 DIAGNOSIS — J45909 Unspecified asthma, uncomplicated: Secondary | ICD-10-CM | POA: Diagnosis not present

## 2023-04-02 DIAGNOSIS — D649 Anemia, unspecified: Secondary | ICD-10-CM | POA: Diagnosis not present

## 2023-04-02 DIAGNOSIS — I480 Paroxysmal atrial fibrillation: Secondary | ICD-10-CM | POA: Insufficient documentation

## 2023-04-02 DIAGNOSIS — K921 Melena: Secondary | ICD-10-CM | POA: Diagnosis not present

## 2023-04-02 DIAGNOSIS — K625 Hemorrhage of anus and rectum: Secondary | ICD-10-CM | POA: Diagnosis present

## 2023-04-02 LAB — CBC WITH DIFFERENTIAL/PLATELET
Abs Immature Granulocytes: 0.02 10*3/uL (ref 0.00–0.07)
Basophils Absolute: 0.1 10*3/uL (ref 0.0–0.1)
Basophils Relative: 1 %
Eosinophils Absolute: 0.3 10*3/uL (ref 0.0–0.5)
Eosinophils Relative: 3 %
HCT: 41.2 % (ref 39.0–52.0)
Hemoglobin: 12.5 g/dL — ABNORMAL LOW (ref 13.0–17.0)
Immature Granulocytes: 0 %
Lymphocytes Relative: 47 %
Lymphs Abs: 3.6 10*3/uL (ref 0.7–4.0)
MCH: 26 pg (ref 26.0–34.0)
MCHC: 30.3 g/dL (ref 30.0–36.0)
MCV: 85.8 fL (ref 80.0–100.0)
Monocytes Absolute: 0.5 10*3/uL (ref 0.1–1.0)
Monocytes Relative: 7 %
Neutro Abs: 3.3 10*3/uL (ref 1.7–7.7)
Neutrophils Relative %: 42 %
Platelets: 186 10*3/uL (ref 150–400)
RBC: 4.8 MIL/uL (ref 4.22–5.81)
RDW: 15.5 % (ref 11.5–15.5)
WBC: 7.8 10*3/uL (ref 4.0–10.5)
nRBC: 0 % (ref 0.0–0.2)

## 2023-04-02 LAB — COMPREHENSIVE METABOLIC PANEL
ALT: 34 U/L (ref 0–44)
AST: 28 U/L (ref 15–41)
Albumin: 3.8 g/dL (ref 3.5–5.0)
Alkaline Phosphatase: 61 U/L (ref 38–126)
Anion gap: 13 (ref 5–15)
BUN: 20 mg/dL (ref 8–23)
CO2: 19 mmol/L — ABNORMAL LOW (ref 22–32)
Calcium: 9.1 mg/dL (ref 8.9–10.3)
Chloride: 104 mmol/L (ref 98–111)
Creatinine, Ser: 1.13 mg/dL (ref 0.61–1.24)
GFR, Estimated: >60 mL/min (ref >60)
Glucose, Bld: 237 mg/dL — ABNORMAL HIGH (ref 70–99)
Potassium: 4.4 mmol/L (ref 3.5–5.1)
Sodium: 136 mmol/L (ref 135–145)
Total Bilirubin: 0.6 mg/dL (ref 0.0–1.2)
Total Protein: 7.2 g/dL (ref 6.5–8.1)

## 2023-04-02 NOTE — ED Triage Notes (Addendum)
 Pt reports after eating lunch today he began to have diarrhea and noticed blood in his stools, pt has had a total of 3 episodes of blood in stool today. Pt states the blood was bright red. Pt denies abd pain denies rectal pain. Pt takes Eliquis.

## 2023-04-03 ENCOUNTER — Observation Stay
Admission: EM | Admit: 2023-04-03 | Discharge: 2023-04-05 | Disposition: A | Discharge status: Home / Self Care | Payer: 59 | Attending: Internal Medicine | Admitting: Internal Medicine

## 2023-04-03 DIAGNOSIS — Z8719 Personal history of other diseases of the digestive system: Secondary | ICD-10-CM | POA: Diagnosis not present

## 2023-04-03 DIAGNOSIS — K5791 Diverticulosis of intestine, part unspecified, without perforation or abscess with bleeding: Secondary | ICD-10-CM

## 2023-04-03 DIAGNOSIS — K625 Hemorrhage of anus and rectum: Secondary | ICD-10-CM | POA: Diagnosis not present

## 2023-04-03 DIAGNOSIS — Z860100 Personal history of colon polyps, unspecified: Secondary | ICD-10-CM | POA: Diagnosis not present

## 2023-04-03 DIAGNOSIS — K922 Gastrointestinal hemorrhage, unspecified: Principal | ICD-10-CM | POA: Diagnosis present

## 2023-04-03 DIAGNOSIS — E1142 Type 2 diabetes mellitus with diabetic polyneuropathy: Secondary | ICD-10-CM

## 2023-04-03 DIAGNOSIS — D62 Acute posthemorrhagic anemia: Secondary | ICD-10-CM | POA: Insufficient documentation

## 2023-04-03 LAB — CBC
HCT: 33.2 % — ABNORMAL LOW (ref 39.0–52.0)
Hemoglobin: 10.5 g/dL — ABNORMAL LOW (ref 13.0–17.0)
MCH: 25.7 pg — ABNORMAL LOW (ref 26.0–34.0)
MCHC: 31.6 g/dL (ref 30.0–36.0)
MCV: 81.2 fL (ref 80.0–100.0)
Platelets: 182 10*3/uL (ref 150–400)
RBC: 4.09 MIL/uL — ABNORMAL LOW (ref 4.22–5.81)
RDW: 15.3 % (ref 11.5–15.5)
WBC: 5.9 10*3/uL (ref 4.0–10.5)
nRBC: 0 % (ref 0.0–0.2)

## 2023-04-03 LAB — LIPASE, BLOOD: Lipase: 26 U/L (ref 11–51)

## 2023-04-03 LAB — URINALYSIS, ROUTINE W REFLEX MICROSCOPIC
Bacteria, UA: NONE SEEN
Bilirubin Urine: NEGATIVE
Glucose, UA: >=500 mg/dL — AB
Hgb urine dipstick: NEGATIVE
Ketones, ur: NEGATIVE mg/dL
Leukocytes,Ua: NEGATIVE
Nitrite: NEGATIVE
Protein, ur: NEGATIVE mg/dL
Specific Gravity, Urine: 1.022 (ref 1.005–1.030)
pH: 5 (ref 5.0–8.0)

## 2023-04-03 LAB — HEMOGLOBIN AND HEMATOCRIT, BLOOD
HCT: 34.1 % — ABNORMAL LOW (ref 39.0–52.0)
HCT: 36.9 % — ABNORMAL LOW (ref 39.0–52.0)
Hemoglobin: 10.5 g/dL — ABNORMAL LOW (ref 13.0–17.0)
Hemoglobin: 11.7 g/dL — ABNORMAL LOW (ref 13.0–17.0)

## 2023-04-03 LAB — TYPE AND SCREEN
ABO/RH(D): A POS
Antibody Screen: NEGATIVE

## 2023-04-03 LAB — CBG MONITORING, ED
Glucose-Capillary: 227 mg/dL — ABNORMAL HIGH (ref 70–99)
Glucose-Capillary: 195 mg/dL — ABNORMAL HIGH (ref 70–99)
Glucose-Capillary: 154 mg/dL — ABNORMAL HIGH (ref 70–99)

## 2023-04-03 LAB — HEMOGLOBIN A1C
Hgb A1c MFr Bld: 10.4 % — ABNORMAL HIGH (ref 4.8–5.6)
Mean Plasma Glucose: 251.78 mg/dL

## 2023-04-03 LAB — HIV ANTIBODY (ROUTINE TESTING W REFLEX): HIV Screen 4th Generation wRfx: NONREACTIVE

## 2023-04-03 LAB — PROTIME-INR
INR: 1 (ref 0.8–1.2)
Prothrombin Time: 13.7 s (ref 11.4–15.2)

## 2023-04-03 MED ORDER — HYDRALAZINE HCL 20 MG/ML IJ SOLN: 5.0000 mg | Freq: Four times a day (QID) | INTRAMUSCULAR | Status: DC | PRN

## 2023-04-03 MED ORDER — INSULIN ASPART 100 UNIT/ML IJ SOLN
0.0000 [IU] | Freq: Every day | INTRAMUSCULAR | Status: DC
  Filled 2023-04-03: qty 1

## 2023-04-03 MED ORDER — INSULIN ASPART 100 UNIT/ML IJ SOLN
0.0000 [IU] | Freq: Three times a day (TID) | INTRAMUSCULAR | Status: DC
  Administered 2023-04-03: 5 [IU] via SUBCUTANEOUS
  Administered 2023-04-03 (×2): 3 [IU] via SUBCUTANEOUS
  Administered 2023-04-04: 5 [IU] via SUBCUTANEOUS
  Administered 2023-04-04: 3 [IU] via SUBCUTANEOUS
  Filled 2023-04-03 (×5): qty 1

## 2023-04-03 MED ORDER — ACETAMINOPHEN 650 MG RE SUPP: 650.0000 mg | Freq: Four times a day (QID) | RECTAL | Status: DC | PRN

## 2023-04-03 MED ORDER — ATORVASTATIN CALCIUM 20 MG PO TABS: 40.0000 mg | ORAL_TABLET | Freq: Every day | ORAL | Status: DC

## 2023-04-03 MED ORDER — ACETAMINOPHEN 325 MG PO TABS: 650.0000 mg | ORAL_TABLET | Freq: Four times a day (QID) | ORAL | Status: DC | PRN

## 2023-04-03 MED ORDER — ONDANSETRON HCL 4 MG/2ML IJ SOLN: 4.0000 mg | Freq: Four times a day (QID) | INTRAMUSCULAR | Status: DC | PRN

## 2023-04-03 MED ORDER — PEG 3350-KCL-NA BICARB-NACL 420 G PO SOLR
4000.0000 mL | Freq: Once | ORAL | Status: AC
  Administered 2023-04-03: 4000 mL via ORAL
  Filled 2023-04-03: qty 4000

## 2023-04-03 MED ORDER — LORATADINE 10 MG PO TABS: 10.0000 mg | ORAL_TABLET | Freq: Every day | ORAL | Status: DC

## 2023-04-03 MED ORDER — ONDANSETRON HCL 4 MG PO TABS: 4.0000 mg | ORAL_TABLET | Freq: Four times a day (QID) | ORAL | Status: DC | PRN

## 2023-04-03 MED ORDER — ALLOPURINOL 100 MG PO TABS
100.0000 mg | ORAL_TABLET | Freq: Every day | ORAL | Status: DC
  Administered 2023-04-03 – 2023-04-04 (×2): 100 mg via ORAL
  Filled 2023-04-03 (×3): qty 1

## 2023-04-03 MED ORDER — PANTOPRAZOLE SODIUM 40 MG IV SOLR
40.0000 mg | INTRAVENOUS | Status: DC
  Administered 2023-04-03 – 2023-04-04 (×2): 40 mg via INTRAVENOUS
  Filled 2023-04-03 (×2): qty 10

## 2023-04-03 MED ORDER — FLUTICASONE PROPIONATE 50 MCG/ACT NA SUSP: 1.0000 | Freq: Two times a day (BID) | NASAL | Status: DC

## 2023-04-03 NOTE — ED Notes (Signed)
 Assumed patient care at approximately [phone] and received report from the previous nurse.

## 2023-04-03 NOTE — Consult Note (Signed)
 Sean Copping, MD [facility]  [address], [address] [place], [place] [address] Phone: [phone] Fax : [phone]  Consultation  Referring Provider:     Dr. Laurita Primary Care Physician:  Joshua Glenn Primary Gastroenterologist:  Dr. Onita         Reason for Consultation:     Lower GI bleed  Date of Admission:  04/03/2023 Date of Consultation:  04/03/2023         HPI:   Sean Roy is a 68 y.o. male who had a colonoscopy in April 2023 with diverticulosis and 5 polyps removed at that time.  The patient reports that he had a sudden onset of rectal bleeding the day prior to admission.  He states he has been here since yesterday and was upset that he did not have any information about what was going on.  The patient has had only 2 bowel movements since being here and he says there was a small amount of blood at that time.  All the bowel movements have been bright red in color.  The patient reports that he has been on Eliquis  for his history of A-fib.  He had a hemoglobin of 13.28 months ago with a hemoglobin of 12.5 on admission yesterday with a repeat of 10.5 today. The patient denies any abdominal pain nausea vomiting fevers chills or black stools.  He also denies any unexplained weight loss.  He was reported that he was told he needed another colonoscopy in 3 years after the previous colonoscopy.  I am now asked to see the patient for rectal bleeding with a history of diverticulosis at his last colonoscopy a little over a year and a half ago.  Past Medical History:  Diagnosis Date  . Diabetes mellitus without complication (HCC)   . Hyperlipidemia   . Hypertension     Past Surgical History:  Procedure Laterality Date  . NO PAST SURGERIES      Prior to Admission medications   Medication Sig Start Date End Date Taking? Authorizing Provider  apixaban  (ELIQUIS ) 5 MG TABS tablet Take 1 tablet (5 mg total) by mouth 2 (two) times daily. 07/31/22 04/03/23 Yes Angelena Smalls, MD   glyBURIDE -metformin  (GLUCOVANCE ) 2.5-500 MG tablet TAKE 2 TABLETS BY MOUTH TWO TIMES DAILY WITH MEALS 05/13/18  Yes Bacigalupo, Jon HERO, MD  allopurinol  (ZYLOPRIM ) 100 MG tablet Take 1 tablet (100 mg total) by mouth daily. 08/28/21 09/27/21  Jeannetta Channing CROME, NP  amoxicillin -clavulanate (AUGMENTIN ) 875-125 MG tablet Take 1 tablet by mouth every 12 (twelve) hours. Patient not taking: Reported on 04/03/2023 12/20/22   Teresa Shelba SAUNDERS, NP  atorvastatin  (LIPITOR) 40 MG tablet Take 1 tablet (40 mg total) by mouth daily. Patient not taking: Reported on 04/03/2023 02/25/18   Bacigalupo, Angela M, MD  cetirizine  (ZYRTEC ) 10 MG tablet Take 1 tablet (10 mg total) by mouth daily. Patient not taking: Reported on 04/03/2023 06/28/22   Cuthriell, Dorn BIRCH, PA-C  diltiazem  (CARDIZEM  CD) 180 MG 24 hr capsule Take 1 capsule (180 mg total) by mouth daily. 07/31/22 09/29/22  Angelena Smalls, MD  empagliflozin  (JARDIANCE ) 10 MG TABS tablet Take 10 mg by mouth daily. Patient not taking: Reported on 04/03/2023 05/06/18   Bacigalupo, Angela M, MD  fluticasone  (FLONASE ) 50 MCG/ACT nasal spray Place 1 spray into both nostrils 2 (two) times daily. Patient not taking: Reported on 04/03/2023 06/28/22   Cuthriell, Dorn D, PA-C  glucose blood (ONE TOUCH ULTRA TEST) test strip Use 1 strip  once a day  [DX 250.00]    [provider]  lisinopril  (ZESTRIL ) 40 MG tablet Take 1 tablet (40 mg total) by mouth daily. Patient not taking: Reported on 04/03/2023 07/11/18   Bacigalupo, Angela M, MD  mometasone  (ELOCON ) 0.1 % cream Apply 1 Application topically as directed. Apply once to twice daily, avoid eye area Patient not taking: Reported on 04/03/2023 02/08/22   Jackquline Sawyer, MD  predniSONE  (STERAPRED UNI-PAK 21 TAB) 10 MG (21) TBPK tablet Take by mouth daily. Take 6 tabs by mouth daily  for 1 days, then 5 tabs for 1 days, then 4 tabs for 1 days, then 3 tabs for 1 days, 2 tabs for 1 days, then 1 tab by mouth daily for 1 days Patient  not taking: Reported on 04/03/2023 12/20/22   Teresa Shelba SAUNDERS, NP  Roflumilast  (ZORYVE ) 0.3 % CREA Apply to affected areas once daily as needed for rash. Patient not taking: Reported on 04/03/2023 12/05/21   Jackquline Sawyer, MD  tacrolimus  (PROTOPIC ) 0.1 % ointment Apply topically as directed. Apply once to twice daily to face as needed Patient not taking: Reported on 04/03/2023 02/08/22   Jackquline Sawyer, MD  TRULICITY  1.5 MG/0.5ML SOPN INJECT SUBCUTANEOUSLY 1.5MG  ONCE A WEEK Patient not taking: Reported on 04/03/2023 03/26/19   Myrla Jon HERO, MD    Family History  Problem Relation Age of Onset  . Healthy Sister   . Healthy Brother   . Healthy Brother   . Healthy Sister      Social History   Tobacco Use  . Smoking status: Never  . Smokeless tobacco: Never  Vaping Use  . Vaping status: Never Used  Substance Use Topics  . Alcohol use: Yes    Alcohol/week: 3.0 standard drinks of alcohol    Types: 3 Shots of liquor per week    Comment: occasional  . Drug use: No    Allergies as of 04/02/2023  . (No Known Allergies)    Review of Systems:    All systems reviewed and negative except where noted in HPI.   Physical Exam:  Vital signs in last 24 hours: Temp:  [98.1 F (36.7 C)-98.5 F (36.9 C)] 98.3 F (36.8 C) (01/14 1251) Pulse Rate:  [67-105] 67 (01/14 1251) Resp:  [18] 18 (01/14 1251) BP: (112-152)/(84-93) 144/90 (01/14 1251) SpO2:  [97 %-100 %] 99 % (01/14 1251) Weight:  [113.4 kg] 113.4 kg (01/13 1924)   General:   Pleasant, cooperative in NAD Head:  Normocephalic and atraumatic. Eyes:   No icterus.   Conjunctiva pink. PERRLA. Ears:  Normal auditory acuity. Neck:  Supple; no masses or thyroidomegaly Lungs: Respirations even and unlabored. Lungs clear to auscultation bilaterally.   No wheezes, crackles, or rhonchi.  Heart:  Regular rate and rhythm;  Without murmur, clicks, rubs or gallops Abdomen:  Soft, nondistended, nontender. Normal bowel sounds. No appreciable  masses or hepatomegaly.  No rebound or guarding.  Rectal:  Not performed. Msk:  Symmetrical without gross deformities.    Extremities:  Without edema, cyanosis or clubbing. Neurologic:  Alert and oriented x3;  grossly normal neurologically. Skin:  Intact without significant lesions or rashes. Cervical Nodes:  No significant cervical adenopathy. Psych:  Alert and cooperative. Normal affect.  LAB RESULTS: Recent Labs    04/02/23 1928 04/03/23 0740  WBC 7.8 5.9  HGB 12.5* 10.5*  HCT 41.2 33.2*  PLT 186 182   BMET Recent Labs    04/02/23 1928  NA 136  K 4.4  CL 104  CO2 19*  GLUCOSE 237*  BUN 20  CREATININE 1.13  CALCIUM  9.1   LFT Recent Labs    04/02/23 1928  PROT 7.2  ALBUMIN 3.8  AST 28  ALT 34  ALKPHOS 61  BILITOT 0.6   PT/INR Recent Labs    04/03/23 0534  LABPROT 13.7  INR 1.0    STUDIES: No results found.    Impression / Plan:   Assessment: Principal Problem:   GI bleeding Active Problems:   Acute blood loss anemia (ABLA)   Lower GI bleed   Quill Ruffini is a 68 y.o. y/o male with bright red blood per rectum with a history of diverticulosis.  The patient had 5 polyps at the last colonoscopy.  The patient likely has a diverticular bleed that has slowed down.  The patient has been on Eliquis  and states he is taking that for A-fib.  The patient's Eliquis  has been held for the last 2 days and his bleeding has significantly decreased.  Plan:  The patient will be set up for a colonoscopy for tomorrow to rule out any other pathology just in case he needs to go back on the Eliquis .  The patient has been told the risks of diverticulosis with recurrent diverticular bleeds and however the risk is increased with anti-inflammatory medication and the bleeding can be more significant while on blood thinners.  The patient has been explained the plan and agrees to it.  He will be set up for colonoscopy for tomorrow.  Thank you for involving me in the care of  this patient.      LOS: 0 days   Sean Copping, MD, [facility] 04/03/2023, 4:29 PM,  Pager [phone] 7am-5pm  Check AMION for 5pm -7am coverage and on weekends   Note: This dictation was prepared with Dragon dictation along with smaller phrase technology. Any transcriptional errors that result from this process are unintentional.

## 2023-04-03 NOTE — H&P (Signed)
 History and Physical    Sean Roy:[id number] DOB: 1955/12/22 DOA: 04/03/2023  PCP: [facility] (Confirm with patient/family/NH records and if not entered, this has to be entered at [facility] point of entry) Patient coming from: Home  I have personally briefly reviewed patient's old medical records in [facility]  Chief Complaint: Rectal bleed  HPI: Sean Roy is a 68 y.o. male with medical history significant of PAF on Eliquis , HTN, HLD, IIDM, peptic ulcer, asthma, presented with lower GI bleed.  Sudden onset, yesterday patient had 3 moderate bloody loose bowel movement from afternoon to evening, a total of 3 episodes, bright red bloody loose bowel movement, denied any abdominal pain no nauseous vomiting no fever or chills.  Denies any chest pain or associated lightheadedness reported patient or shortness of breath.  Patient reported that about 85 school he had lower mild GI bleed with bright red blood and EGD found stomach ulcer.  2 years ago, patient had a colonoscopy which showed tubular adenoma polyps and diverticulosis without bleeding.  Since summer last year patient has been started on Eliquis  and last dose of Eliquis  was yesterday morning. ED Course: Borderline tachycardia and borderline hypotensive, initial hemoglobin 12.5, BUN 20, creatinine 1.1.  Review of Systems: As per HPI otherwise 14 point review of systems negative.    Past Medical History:  Diagnosis Date   Diabetes mellitus without complication (HCC)    Hyperlipidemia    Hypertension     Past Surgical History:  Procedure Laterality Date   NO PAST SURGERIES       reports that he has never smoked. He has never used smokeless tobacco. He reports current alcohol use of about 3.0 standard drinks of alcohol per week. He reports that he does not use drugs.  No Known Allergies  Family History  Problem Relation Age of Onset   Healthy Sister    Healthy Brother    Healthy Brother    Healthy  Sister     Prior to Admission medications   Medication Sig Start Date End Date Taking? Authorizing Provider  allopurinol  (ZYLOPRIM ) 100 MG tablet Take 1 tablet (100 mg total) by mouth daily. 08/28/21 09/27/21  Jeannetta Channing CROME, NP  amoxicillin -clavulanate (AUGMENTIN ) 875-125 MG tablet Take 1 tablet by mouth every 12 (twelve) hours. 12/20/22   White, Shelba SAUNDERS, NP  apixaban  (ELIQUIS ) 5 MG TABS tablet Take 1 tablet (5 mg total) by mouth 2 (two) times daily. 07/31/22 09/29/22  Angelena Smalls, MD  atorvastatin  (LIPITOR) 40 MG tablet Take 1 tablet (40 mg total) by mouth daily. 02/25/18   Myrla Jon HERO, MD  cetirizine  (ZYRTEC ) 10 MG tablet Take 1 tablet (10 mg total) by mouth daily. 06/28/22   Cuthriell, Dorn BIRCH, PA-C  diltiazem  (CARDIZEM  CD) 180 MG 24 hr capsule Take 1 capsule (180 mg total) by mouth daily. 07/31/22 09/29/22  Angelena Smalls, MD  empagliflozin  (JARDIANCE ) 10 MG TABS tablet Take 10 mg by mouth daily. 05/06/18   Bacigalupo, Angela M, MD  fluticasone  (FLONASE ) 50 MCG/ACT nasal spray Place 1 spray into both nostrils 2 (two) times daily. 06/28/22   Cuthriell, Dorn D, PA-C  glucose blood (ONE TOUCH ULTRA TEST) test strip Use 1 strip  once a day  [DX 250.00]    [provider]  glyBURIDE -metformin  (GLUCOVANCE ) 2.5-500 MG tablet TAKE 2 TABLETS BY MOUTH TWO TIMES DAILY WITH MEALS 05/13/18   Bacigalupo, Jon HERO, MD  lisinopril  (ZESTRIL ) 40 MG tablet Take 1 tablet (40 mg total) by mouth daily.  07/11/18   Bacigalupo, Angela M, MD  mometasone  (ELOCON ) 0.1 % cream Apply 1 Application topically as directed. Apply once to twice daily, avoid eye area 02/08/22   Jackquline Sawyer, MD  predniSONE  (STERAPRED UNI-PAK 21 TAB) 10 MG (21) TBPK tablet Take by mouth daily. Take 6 tabs by mouth daily  for 1 days, then 5 tabs for 1 days, then 4 tabs for 1 days, then 3 tabs for 1 days, 2 tabs for 1 days, then 1 tab by mouth daily for 1 days 12/20/22   Teresa Shelba SAUNDERS, NP  Roflumilast  (ZORYVE ) 0.3 % CREA  Apply to affected areas once daily as needed for rash. 12/05/21   Jackquline Sawyer, MD  tacrolimus  (PROTOPIC ) 0.1 % ointment Apply topically as directed. Apply once to twice daily to face as needed 02/08/22   Jackquline Sawyer, MD  TRULICITY  1.5 MG/0.5ML [facility] INJECT SUBCUTANEOUSLY 1.5MG  ONCE A WEEK 03/26/19   Myrla Jon HERO, MD    Physical Exam: Vitals:   04/03/23 0700 04/03/23 0730 04/03/23 0800 04/03/23 0830  BP: (!) 133/93 133/86 139/85 134/88  Pulse: 85 72 70 75  Resp:      Temp:      TempSrc:      SpO2: 98% 99% 99% 100%  Weight:      Height:        Constitutional: NAD, calm, comfortable Vitals:   04/03/23 0700 04/03/23 0730 04/03/23 0800 04/03/23 0830  BP: (!) 133/93 133/86 139/85 134/88  Pulse: 85 72 70 75  Resp:      Temp:      TempSrc:      SpO2: 98% 99% 99% 100%  Weight:      Height:       Eyes: PERRL, lids and conjunctivae normal ENMT: Mucous membranes are moist. Posterior pharynx clear of any exudate or lesions.Normal dentition.  Neck: normal, supple, no masses, no thyromegaly Respiratory: clear to auscultation bilaterally, no wheezing, no crackles. Normal respiratory effort. No accessory muscle use.  Cardiovascular: Regular rate and rhythm, no murmurs / rubs / gallops. No extremity edema. 2+ pedal pulses. No carotid bruits.  Abdomen: no tenderness, no masses palpated. No hepatosplenomegaly. Bowel sounds positive.  Musculoskeletal: no clubbing / cyanosis. No joint deformity upper and lower extremities. Good ROM, no contractures. Normal muscle tone.  Skin: no rashes, lesions, ulcers. No induration Neurologic: CN 2-12 grossly intact. Sensation intact, DTR normal. Strength 5/5 in all 4.  Psychiatric: Normal judgment and insight. Alert and oriented x 3. Normal mood.     Labs on Admission: I have personally reviewed following labs and imaging studies  CBC: Recent Labs  Lab 04/02/23 1928 04/03/23 0740  WBC 7.8 5.9  NEUTROABS 3.3  --   HGB 12.5* 10.5*  HCT 41.2  33.2*  MCV 85.8 81.2  PLT 186 182   Basic Metabolic Panel: Recent Labs  Lab 04/02/23 1928  NA 136  K 4.4  CL 104  CO2 19*  GLUCOSE 237*  BUN 20  CREATININE 1.13  CALCIUM  9.1   GFR: Estimated Creatinine Clearance: 83.4 mL/min (by C-G formula based on SCr of 1.13 mg/dL). Liver Function Tests: Recent Labs  Lab 04/02/23 1928  AST 28  ALT 34  ALKPHOS 61  BILITOT 0.6  PROT 7.2  ALBUMIN 3.8   Recent Labs  Lab 04/03/23 0534  LIPASE 26   No results for input(s): AMMONIA in the last 168 hours. Coagulation Profile: Recent Labs  Lab 04/03/23 0534  INR 1.0   Cardiac Enzymes: No  results for input(s): CKTOTAL, CKMB, CKMBINDEX, TROPONINI in the last 168 hours. BNP (last 3 results) No results for input(s): PROBNP in the last 8760 hours. HbA1C: No results for input(s): HGBA1C in the last 72 hours. CBG: Recent Labs  Lab 04/03/23 0737  GLUCAP 227*   Lipid Profile: No results for input(s): CHOL, HDL, LDLCALC, TRIG, CHOLHDL, LDLDIRECT in the last 72 hours. Thyroid Function Tests: No results for input(s): TSH, T4TOTAL, FREET4, T3FREE, THYROIDAB in the last 72 hours. Anemia Panel: No results for input(s): VITAMINB12, FOLATE, FERRITIN, TIBC, IRON, RETICCTPCT in the last 72 hours. Urine analysis:    Component Value Date/Time   COLORURINE Yellow 05/02/2013 0319   APPEARANCEUR Hazy 05/02/2013 0319   LABSPEC 1.021 05/02/2013 0319   PHURINE 5.0 05/02/2013 0319   GLUCOSEU >=500 05/02/2013 0319   HGBUR Negative 05/02/2013 0319   BILIRUBINUR negative 03/12/2017 0851   BILIRUBINUR Negative 05/02/2013 0319   KETONESUR Negative 05/02/2013 0319   PROTEINUR trace 03/12/2017 0851   PROTEINUR Negative 05/02/2013 0319   UROBILINOGEN 0.2 03/12/2017 0851   NITRITE negative 03/12/2017 0851   NITRITE Negative 05/02/2013 0319   LEUKOCYTESUR Negative 03/12/2017 0851   LEUKOCYTESUR Negative 05/02/2013 0319    Radiological Exams on  Admission: No results found.  EKG: Ordered  Assessment/Plan Principal Problem:   GI bleeding Active Problems:   Acute blood loss anemia (ABLA)   Lower GI bleed  (please populate well all problems here in Problem List. (For example, if patient is on BP meds at home and you resume or decide to hold them, it is a problem that needs to be her. Same for CAD, COPD, HLD and so on)  Acute blood loss anemia Hematochezia -Appears to be self-limiting, clinically suspect acute diverticulosis bleeding. -Hemoglobin trending down 12.5> 10.5 but no other signs of active bleeding as patient is not tachycardia or hypotensive.  Clinically appears to be euvolemic.  Plan to recheck H&H this afternoon and evening, if there is any signs of rebleeding or drop of H&H will consider stat CT angiogram.  Discussed with on-call GI Dr. Jinny.  Reviewed patient most recent colonoscopy report in 2023. -NPO for now, till H/H stabilized -No indication to neutralize Eliquis , last dose was >24 hours ago. -Other DDx, with remote history of peptic ulcer disorder, will also put patient on PPI for now.  PAF -Hold off Eliquis  -Hold off Cardizem   HTN -Hold off home BP meds -Start as needed hydralazine   IIDM with hyperglycemia -SSI   Asthma -Stable, no acute concern  DVT prophylaxis: SCD Code Status: Full code Family Communication: None at bedside Disposition Plan: Patient sick with acute lower GI bleed requiring inpatient GI consultation, expect more than 2 midnight hospital stay. Consults called: GI Dr. Jinny Admission status: Tele admit   Cort ONEIDA Mana MD [facility] Pager [phone]  04/03/2023, 9:45 AM

## 2023-04-03 NOTE — ED Provider Notes (Signed)
 [facility] Provider Note    Event Date/Time   First MD Initiated Contact with Patient 04/03/23 [id number]     (approximate)   History   Blood In Stools   HPI  Colten Bouler is a 68 y.o. male with history of hypertension, diabetes, hyperlipidemia, atrial fibrillation on Eliquis  who presents to the emergency department complaints of bloody bowel movements for the past day.  Reports several episodes at home.  No melena.  No vomiting.  No abdominal pain.  No chest pain or shortness of breath.  Reports last colonoscopy was several years ago and he had polyps removed.   History provided by patient.    Past Medical History:  Diagnosis Date   Diabetes mellitus without complication (HCC)    Hyperlipidemia    Hypertension     Past Surgical History:  Procedure Laterality Date   NO PAST SURGERIES      MEDICATIONS:  Prior to Admission medications   Medication Sig Start Date End Date Taking? Authorizing Provider  allopurinol  (ZYLOPRIM ) 100 MG tablet Take 1 tablet (100 mg total) by mouth daily. 08/28/21 09/27/21  Jeannetta Channing CROME, NP  amoxicillin -clavulanate (AUGMENTIN ) 875-125 MG tablet Take 1 tablet by mouth every 12 (twelve) hours. 12/20/22   White, Shelba SAUNDERS, NP  apixaban  (ELIQUIS ) 5 MG TABS tablet Take 1 tablet (5 mg total) by mouth 2 (two) times daily. 07/31/22 09/29/22  Angelena Smalls, MD  atorvastatin  (LIPITOR) 40 MG tablet Take 1 tablet (40 mg total) by mouth daily. 02/25/18   Myrla Jon HERO, MD  cetirizine  (ZYRTEC ) 10 MG tablet Take 1 tablet (10 mg total) by mouth daily. 06/28/22   Cuthriell, Dorn BIRCH, PA-C  diltiazem  (CARDIZEM  CD) 180 MG 24 hr capsule Take 1 capsule (180 mg total) by mouth daily. 07/31/22 09/29/22  Angelena Smalls, MD  empagliflozin  (JARDIANCE ) 10 MG TABS tablet Take 10 mg by mouth daily. 05/06/18   Bacigalupo, Angela M, MD  fluticasone  (FLONASE ) 50 MCG/ACT nasal spray Place 1 spray into both nostrils 2 (two) times daily. 06/28/22    Cuthriell, Dorn D, PA-C  glucose blood (ONE TOUCH ULTRA TEST) test strip Use 1 strip  once a day  [DX 250.00]    [provider]  glyBURIDE -metformin  (GLUCOVANCE ) 2.5-500 MG tablet TAKE 2 TABLETS BY MOUTH TWO TIMES DAILY WITH MEALS 05/13/18   Bacigalupo, Jon HERO, MD  lisinopril  (ZESTRIL ) 40 MG tablet Take 1 tablet (40 mg total) by mouth daily. 07/11/18   Bacigalupo, Angela M, MD  mometasone  (ELOCON ) 0.1 % cream Apply 1 Application topically as directed. Apply once to twice daily, avoid eye area 02/08/22   Jackquline Sawyer, MD  predniSONE  (STERAPRED UNI-PAK 21 TAB) 10 MG (21) TBPK tablet Take by mouth daily. Take 6 tabs by mouth daily  for 1 days, then 5 tabs for 1 days, then 4 tabs for 1 days, then 3 tabs for 1 days, 2 tabs for 1 days, then 1 tab by mouth daily for 1 days 12/20/22   Teresa Shelba R, NP  Roflumilast  (ZORYVE ) 0.3 % CREA Apply to affected areas once daily as needed for rash. 12/05/21   Jackquline Sawyer, MD  tacrolimus  (PROTOPIC ) 0.1 % ointment Apply topically as directed. Apply once to twice daily to face as needed 02/08/22   Jackquline Sawyer, MD  TRULICITY  1.5 MG/0.5ML [facility] INJECT SUBCUTANEOUSLY 1.5MG  ONCE A WEEK 03/26/19   Myrla Jon HERO, MD    Physical Exam   Triage Vital Signs: ED Triage Vitals  Encounter Vitals Group  BP 04/02/23 1925 (!) 112/91     Systolic BP Percentile --      Diastolic BP Percentile --      Pulse Rate 04/02/23 1925 (!) 105     Resp 04/02/23 1925 18     Temp 04/02/23 1925 98.5 F (36.9 C)     Temp Source 04/02/23 1925 Oral     SpO2 04/02/23 1925 98 %     Weight 04/02/23 1924 250 lb (113.4 kg)     Height 04/02/23 1924 5' 11 (1.803 m)     Head Circumference --      Peak Flow --      Pain Score 04/02/23 1924 0     Pain Loc --      Pain Education --      Exclude from Growth Chart --     Most recent vital signs: Vitals:   04/02/23 1925 04/03/23 0600  BP: (!) 112/91 (!) 152/84  Pulse: (!) 105 73  Resp: 18 18  Temp: 98.5 F (36.9 C)  98.1 F (36.7 C)  SpO2: 98% 99%    CONSTITUTIONAL: Alert, responds appropriately to questions. Well-appearing; well-nourished HEAD: Normocephalic, atraumatic EYES: Conjunctivae clear, pupils appear equal, sclera nonicteric ENT: normal nose; moist mucous membranes NECK: Supple, normal ROM CARD: Regular and tachycardic; S1 and S2 appreciated RESP: Normal chest excursion without splinting or tachypnea; breath sounds clear and equal bilaterally; no wheezes, no rhonchi, no rales, no hypoxia or respiratory distress, speaking full sentences ABD/GI: Non-distended; soft, non-tender, no rebound, no guarding, no peritoneal signs RECTAL:  Normal rectal tone,+ gross maroon-colored blood on rectal exam, no melena, guaiac positive, no hemorrhoids appreciated, nontender rectal exam, no fecal impaction.  BACK: The back appears normal EXT: Normal ROM in all joints; no deformity noted, no edema SKIN: Normal color for age and race; warm; no rash on exposed skin NEURO: Moves all extremities equally, normal speech PSYCH: The patient's mood and manner are appropriate.   ED Results / Procedures / Treatments   LABS: (all labs ordered are listed, but only abnormal results are displayed) Labs Reviewed  CBC WITH DIFFERENTIAL/PLATELET - Abnormal; Notable for the following components:      Result Value   Hemoglobin 12.5 (*)    All other components within normal limits  COMPREHENSIVE METABOLIC PANEL - Abnormal; Notable for the following components:   CO2 19 (*)    Glucose, Bld 237 (*)    All other components within normal limits  LIPASE, BLOOD  PROTIME-INR  URINALYSIS, ROUTINE W REFLEX MICROSCOPIC  TYPE AND SCREEN     EKG:  EKG Interpretation Date/Time:    Ventricular Rate:    PR Interval:    QRS Duration:    QT Interval:    QTC Calculation:   R Axis:      Text Interpretation:           RADIOLOGY: My personal review and interpretation of imaging:    I have personally reviewed all  radiology reports.   No results found.   PROCEDURES:  Critical Care performed: No      Procedures    IMPRESSION / MDM / ASSESSMENT AND PLAN / ED COURSE  I reviewed the triage vital signs and the nursing notes.    Patient here with rectal bleeding on Eliquis .    DIFFERENTIAL DIAGNOSIS (includes but not limited to):   Colitis, diverticulitis, diverticular bleed, hemorrhoids, anemia   Patient's presentation is most consistent with acute presentation with potential threat to life or bodily  function.   PLAN: Workup initiated from triage.  Hemoglobin of 12.5.  Normal platelets.  Normal INR.  Patient is tachycardic but hemodynamically stable otherwise.  Abdominal exam benign.  Does have gross maroon-colored blood on rectal exam and is on blood thinners.  I feel he will need admission to monitor his vital signs, hemoglobin and GI consultation if bleeding does not resolve spontaneously.   MEDICATIONS GIVEN IN ED: Medications - No data to display   ED COURSE:  Consulted and discussed patient's case with hospitalist, Dr. Lawence.  I have recommended admission and consulting physician agrees and will place admission orders.  Patient (and family if present) agree with this plan.   I reviewed all nursing notes, vitals, pertinent previous records.  All labs, EKGs, imaging ordered have been independently reviewed and interpreted by myself.      OUTSIDE RECORDS REVIEWED: Reviewed last PCP and cardiology note.       FINAL CLINICAL IMPRESSION(S) / ED DIAGNOSES   Final diagnoses:  Lower gastrointestinal bleed     Rx / DC Orders   ED Discharge Orders     None        Note:  This document was prepared using Dragon voice recognition software and may include unintentional dictation errors.   [facility], Josette SAILOR, DO 04/03/23 [phone]

## 2023-04-04 ENCOUNTER — Encounter: Payer: Self-pay | Admitting: Internal Medicine

## 2023-04-04 ENCOUNTER — Inpatient Hospital Stay: Payer: 59 | Admitting: Anesthesiology

## 2023-04-04 ENCOUNTER — Encounter
Admission: EM | Disposition: A | Discharge status: Home / Self Care | Payer: Self-pay | Source: Home / Self Care | Attending: Emergency Medicine

## 2023-04-04 DIAGNOSIS — K920 Hematemesis: Secondary | ICD-10-CM

## 2023-04-04 DIAGNOSIS — K922 Gastrointestinal hemorrhage, unspecified: Principal | ICD-10-CM

## 2023-04-04 DIAGNOSIS — K5730 Diverticulosis of large intestine without perforation or abscess without bleeding: Secondary | ICD-10-CM

## 2023-04-04 DIAGNOSIS — K5791 Diverticulosis of intestine, part unspecified, without perforation or abscess with bleeding: Secondary | ICD-10-CM

## 2023-04-04 HISTORY — PX: COLONOSCOPY WITH PROPOFOL: SHX5780

## 2023-04-04 LAB — GLUCOSE, CAPILLARY
Glucose-Capillary: 198 mg/dL — ABNORMAL HIGH (ref 70–99)
Glucose-Capillary: 206 mg/dL — ABNORMAL HIGH (ref 70–99)
Glucose-Capillary: 233 mg/dL — ABNORMAL HIGH (ref 70–99)
Glucose-Capillary: 217 mg/dL — ABNORMAL HIGH (ref 70–99)

## 2023-04-04 LAB — CBC
HCT: 30.7 % — ABNORMAL LOW (ref 39.0–52.0)
Hemoglobin: 9.8 g/dL — ABNORMAL LOW (ref 13.0–17.0)
MCH: 26.1 pg (ref 26.0–34.0)
MCHC: 31.9 g/dL (ref 30.0–36.0)
MCV: 81.9 fL (ref 80.0–100.0)
Platelets: 161 10*3/uL (ref 150–400)
RBC: 3.75 MIL/uL — ABNORMAL LOW (ref 4.22–5.81)
RDW: 15.1 % (ref 11.5–15.5)
WBC: 5.1 10*3/uL (ref 4.0–10.5)
nRBC: 0 % (ref 0.0–0.2)

## 2023-04-04 SURGERY — COLONOSCOPY WITH PROPOFOL
Anesthesia: General

## 2023-04-04 MED ORDER — PROPOFOL 500 MG/50ML IV EMUL
INTRAVENOUS | Status: DC | PRN
  Administered 2023-04-04: 110 mg via INTRAVENOUS
  Administered 2023-04-04: 115 ug/kg/min via INTRAVENOUS

## 2023-04-04 MED ORDER — PHENYLEPHRINE 80 MCG/ML (10ML) SYRINGE FOR IV PUSH (FOR BLOOD PRESSURE SUPPORT)
PREFILLED_SYRINGE | INTRAVENOUS | Status: AC
  Filled 2023-04-04: qty 10

## 2023-04-04 MED ORDER — LIDOCAINE HCL (PF) 2 % IJ SOLN
INTRAMUSCULAR | Status: DC | PRN
  Administered 2023-04-04: 100 mg via INTRADERMAL

## 2023-04-04 MED ORDER — PHENYLEPHRINE 80 MCG/ML (10ML) SYRINGE FOR IV PUSH (FOR BLOOD PRESSURE SUPPORT)
PREFILLED_SYRINGE | INTRAVENOUS | Status: DC | PRN
  Administered 2023-04-04: 80 ug via INTRAVENOUS

## 2023-04-04 MED ORDER — SODIUM CHLORIDE 0.9 % IV SOLN: INTRAVENOUS | Status: DC | PRN

## 2023-04-04 NOTE — Transfer of Care (Signed)
 Immediate Anesthesia Transfer of Care Note  Patient: Sean Roy  Procedure(s) Performed: COLONOSCOPY WITH PROPOFOL   Patient Location: Endoscopy Unit  Anesthesia Type:General  Level of Consciousness: awake, alert , and oriented  Airway & Oxygen Therapy: Patient Spontanous Breathing  Post-op Assessment: Report given to RN and Post -op Vital signs reviewed and stable  Post vital signs: Reviewed and stable  Last Vitals:  Vitals Value Taken Time  BP 106/78 04/04/23 1435  Temp 35.7 1435  Pulse 90 04/04/23 1437  Resp 20 04/04/23 1437  SpO2 100 % 04/04/23 1437  Vitals shown include unfiled device data.  Last Pain:  Vitals:   04/04/23 1250  TempSrc: Temporal  PainSc: 0-No pain      Patients Stated Pain Goal: 0 (04/03/23 2100)  Complications: No notable events documented.

## 2023-04-04 NOTE — Progress Notes (Addendum)
 Patient returned from procedure. Alert and oriented x4. Asking for food and to go home. Notified MD via epic chat. Awaiting response.  Addendum: Dr Leory Rands MD stated that he had already seen the patient and to continue clear liquids and monitor overnight. Provided this update to the patient and the patient  continued to state he wanted to eat something else and wants to go home. Pt requested to see the doctor again.   Notified Dr Leory Rands via epic chat of patients continued requests. Dr Ole Berkeley was added to the chat by Dr Leory Rands to gain clairty on the situation. This RN requested someone to speak with the patient over his continued requests for food and to speak with a doctor. Dr Ole Berkeley attempted to call the patient and was unable to reach him. This RN spoke with Dr Ole Berkeley via telephone and then made sure the patient had his phone next to him and was able to speak with the doctor. Diet was advanced at this time by Dr Ole Berkeley.

## 2023-04-04 NOTE — Op Note (Signed)
 [facility] Gastroenterology Patient Name: Murdock Buelow Procedure Date: 04/04/2023 1:53 PM MRN: [id number] Account #: [id number] Date of Birth: [birth year] Admit Type: Inpatient Age: 68 Room: [facility] ENDO ROOM 4 Gender: Male Note Status: Finalized Instrument Name: Charlyn Cooley [id number] Procedure:             Colonoscopy Indications:           Hematochezia Providers:             Marnee Sink MD, MD Medicines:             Propofol  per Anesthesia Complications:         No immediate complications. Procedure:             Pre-Anesthesia Assessment:                        - Prior to the procedure, a History and Physical was                         performed, and patient medications and allergies were                         reviewed. The patient's tolerance of previous                         anesthesia was also reviewed. The risks and benefits                         of the procedure and the sedation options and risks                         were discussed with the patient. All questions were                         answered, and informed consent was obtained. Prior                         Anticoagulants: The patient has taken no anticoagulant                         or antiplatelet agents. ASA Grade Assessment: II - A                         patient with mild systemic disease. After reviewing                         the risks and benefits, the patient was deemed in                         satisfactory condition to undergo the procedure.                        After obtaining informed consent, the colonoscope was                         passed under direct vision. Throughout the procedure,                         the patient's blood pressure, pulse, and oxygen  saturations were monitored continuously. The                         Colonoscope was introduced through the anus and                         advanced to the the cecum, identified by appendiceal                          orifice and ileocecal valve. The colonoscopy was                         performed without difficulty. The patient tolerated                         the procedure well. The quality of the bowel                         preparation was good. Findings:      The perianal and digital rectal examinations were normal.      A few small-mouthed diverticula were found in the entire colon.      Hematin (altered blood/coffee-ground-like material) was found in the       rectum, in the sigmoid colon and in the descending colon. Impression:            - Diverticulosis in the entire examined colon.                        - Blood in the rectum, in the sigmoid colon and in the                         descending colon.                        - No specimens collected. Recommendation:        - Return patient to hospital ward for ongoing care.                        - Resume previous diet.                        - Continue present medications.                        - The likely cause of the patients bleeding is                         diverticular.                        If continued bleeding then IR vs vascular surgery for                         embolization. Procedure Code(s):     --- Professional ---                        [phone], Colonoscopy, flexible; diagnostic, including  collection of specimen(s) by brushing or washing, when                         performed (separate procedure) Diagnosis Code(s):     --- Professional ---                        K92.1, Melena (includes Hematochezia) CPT copyright 2022 American [employer] Association. All rights reserved. The codes documented in this report are preliminary and upon coder review may  be revised to meet current compliance requirements. Marnee Sink MD, MD 04/04/2023 2:37:00 PM This report has been signed electronically. Number of Addenda: 0 Note Initiated On: 04/04/2023 1:53 PM Scope Withdrawal Time: 0 hours  3 minutes 58 seconds  Total Procedure Duration: 0 hours 10 minutes 7 seconds  Estimated Blood Loss:  Estimated blood loss: none.      [facility]

## 2023-04-04 NOTE — Progress Notes (Signed)
 The patient was explained the results of the exam today and insisted on being started on a diet.  The patient will be started on a regular diet.  There is nothing further to do endoscopically from a GI point of view and if any further bleeding should occur then the patient will need to go to interventional radiology or vascular surgery for a endoluminal treatment.  I will sign off.  Please call if any further GI concerns or questions.  We would like to thank you for the opportunity to participate in the care of Sean Roy.

## 2023-04-04 NOTE — Plan of Care (Signed)

## 2023-04-04 NOTE — Progress Notes (Signed)
 Pt is refusing bed alarm post procedure. Pt states he feels fine. VSS and pt is alert and oriented. Educated patient on the need for continued safety measures and pt verbalizes understanding but states that he wants to be able to go to the bathroom. Educated pt to call for staff and pt states " turn off the bed alarm"   Pt also is refusing non skid socks. CN notifeid.

## 2023-04-04 NOTE — Progress Notes (Addendum)
 Progress Note    Sean Roy  KGU:[id number] DOB: [birth year]  DOA: 04/03/2023 PCP: Cherisse Cornell      Brief Narrative:    Medical records reviewed and are as summarized below:  Sean Roy is a 68 y.o. male with medical history significant for paroxysmal atrial fibrillation and Eliquis , hypertension, hyperlipidemia, type II DM, peptic ulcer disease, asthma, who presented to the hospital with rectal bleeding.     Assessment/Plan:   Principal Problem:   GI bleeding Active Problems:   Acute blood loss anemia (ABLA)   Lower GI bleed    Body mass index is 34.87 kg/m.  (Obesity)   Acute lower GI bleeding: S/p colonoscopy by Dr. Ole Berkeley, gastroenterologist.  Colonoscopy showed diverticulosis in the entire colon.  There was also blood in the rectum, sigmoid and descending colon. He recommended IR or vascular surgery consult for further evaluation if patient has recurrent rectal bleeding.   Acute blood loss anemia: Hemoglobin dropped from 12.5-9.8.  No indication for blood transfusion at this time.  Monitor H&H.   Paroxysmal atrial fibrillation: Eliquis  and Cardizem  have been held.   Type II DM: NovoLog  as needed for hyperglycemia.   Comorbidities include hypertension, asthma     Diet Order             Diet NPO time specified  Diet effective 0500                            Consultants: Gastroenterologist  Procedures: Colonoscopy    Medications:    allopurinol   100 mg Oral Daily   insulin  aspart  0-15 Units Subcutaneous TID WC   insulin  aspart  0-5 Units Subcutaneous QHS   pantoprazole  (PROTONIX ) IV  40 mg Intravenous Q24H   Continuous Infusions:   Anti-infectives (From admission, onward)    None              Family Communication/Anticipated D/C date and plan/Code Status   DVT prophylaxis: SCDs Start: 04/03/23 0834     Code Status: Full Code  Family Communication: None Disposition Plan: Plan to  discharge home   Status is: Inpatient Remains inpatient appropriate because: GI bleeding       Subjective:   Interval events noted.  He complains of rectal bleeding.  He said he had multiple bloody stools mainly because he was using the colon prep.  No abdominal pain or vomiting.  He was seen prior to colonoscopy today.  Objective:    Vitals:   04/03/23 1930 04/03/23 2026 04/03/23 2119 04/04/23 0217  BP: (!) 150/108  (!) 144/75 138/75  Pulse: 79  86 85  Resp: 12  16 16   Temp:  98.3 F (36.8 C) 97.8 F (36.6 C) 98.6 F (37 C)  TempSrc:  Oral Oral Oral  SpO2: 100%  (!) 83% 99%  Weight:      Height:       No data found.  No intake or output data in the 24 hours ending 04/04/23 0903 Filed Weights   04/02/23 1924  Weight: 113.4 kg    Exam:  GEN: NAD SKIN: Warm and dry EYES: No pallor or icterus ENT: MMM CV: RRR PULM: CTA B ABD: soft, obese, NT, +BS CNS: AAO x 3, non focal EXT: No edema or tenderness        Data Reviewed:   I have personally reviewed following labs and imaging studies:  Labs: Labs show the following:   Basic  Metabolic Panel: Recent Labs  Lab 04/02/23 1928  NA 136  K 4.4  CL 104  CO2 19*  GLUCOSE 237*  BUN 20  CREATININE 1.13  CALCIUM  9.1   GFR Estimated Creatinine Clearance: 83.4 mL/min (by C-G formula based on SCr of 1.13 mg/dL). Liver Function Tests: Recent Labs  Lab 04/02/23 1928  AST 28  ALT 34  ALKPHOS 61  BILITOT 0.6  PROT 7.2  ALBUMIN 3.8   Recent Labs  Lab 04/03/23 0534  LIPASE 26   No results for input(s): "AMMONIA" in the last 168 hours. Coagulation profile Recent Labs  Lab 04/03/23 0534  INR 1.0    CBC: Recent Labs  Lab 04/02/23 1928 04/03/23 0740 04/03/23 1711 04/03/23 2033 04/04/23 0547  WBC 7.8 5.9  --   --  5.1  NEUTROABS 3.3  --   --   --   --   HGB 12.5* 10.5* 10.5* 11.7* 9.8*  HCT 41.2 33.2* 34.1* 36.9* 30.7*  MCV 85.8 81.2  --   --  81.9  PLT 186 182  --   --  161    Cardiac Enzymes: No results for input(s): "CKTOTAL", "CKMB", "CKMBINDEX", "TROPONINI" in the last 168 hours. BNP (last 3 results) No results for input(s): "PROBNP" in the last 8760 hours. CBG: Recent Labs  Lab 04/03/23 0737 04/03/23 1133 04/03/23 1647  GLUCAP 227* 195* 154*   D-Dimer: No results for input(s): "DDIMER" in the last 72 hours. Hgb A1c: Recent Labs    04/03/23 0740  HGBA1C 10.4*   Lipid Profile: No results for input(s): "CHOL", "HDL", "LDLCALC", "TRIG", "CHOLHDL", "LDLDIRECT" in the last 72 hours. Thyroid function studies: No results for input(s): "TSH", "T4TOTAL", "T3FREE", "THYROIDAB" in the last 72 hours.  Invalid input(s): "FREET3" Anemia work up: No results for input(s): "VITAMINB12", "FOLATE", "FERRITIN", "TIBC", "IRON", "RETICCTPCT" in the last 72 hours. Sepsis Labs: Recent Labs  Lab 04/02/23 1928 04/03/23 0740 04/04/23 0547  WBC 7.8 5.9 5.1    Microbiology No results found for this or any previous visit (from the past 240 hours).  Procedures and diagnostic studies:  No results found.             LOS: 1 day   Jayion Schneck  [facility]   Pager on [email or web address]. If 7PM-7AM, please contact night-coverage at [email or web address]     04/04/2023, 9:03 AM

## 2023-04-04 NOTE — Anesthesia Preprocedure Evaluation (Signed)
 Anesthesia Evaluation  Patient identified by MRN, date of birth, ID band Patient awake    Reviewed: Allergy & Precautions, NPO status , Patient's Chart, lab work & pertinent test results  History of Anesthesia Complications Negative for: history of anesthetic complications  Airway Mallampati: III  TM Distance: >3 FB Neck ROM: full    Dental no notable dental hx.    Pulmonary neg pulmonary ROS   Pulmonary exam normal        Cardiovascular hypertension, On Medications + dysrhythmias Atrial Fibrillation      Neuro/Psych negative neurological ROS  negative psych ROS   GI/Hepatic Neg liver ROS, PUD,,,  Endo/Other  negative endocrine ROSdiabetes    Renal/GU negative Renal ROS  negative genitourinary   Musculoskeletal   Abdominal   Peds  Hematology  (+) Blood dyscrasia, anemia   Anesthesia Other Findings Past Medical History: No date: Diabetes mellitus without complication (HCC) No date: Hyperlipidemia No date: Hypertension  Past Surgical History: No date: NO PAST SURGERIES  BMI    Body Mass Index: 34.87 kg/m      Reproductive/Obstetrics negative OB ROS                              Anesthesia Physical Anesthesia Plan  ASA: 3  Anesthesia Plan: General   Post-op Pain Management: Minimal or no pain anticipated   Induction: Intravenous  PONV Risk Score and Plan: 1 and Propofol  infusion and TIVA  Airway Management Planned: Natural Airway and Nasal Cannula  Additional Equipment:   Intra-op Plan:   Post-operative Plan:   Informed Consent: I have reviewed the patients History and Physical, chart, labs and discussed the procedure including the risks, benefits and alternatives for the proposed anesthesia with the patient or authorized representative who has indicated his/her understanding and acceptance.     Dental Advisory Given  Plan Discussed with: Anesthesiologist, CRNA  and Surgeon  Anesthesia Plan Comments: (Patient consented for risks of anesthesia including but not limited to:  - adverse reactions to medications - risk of airway placement if required - damage to eyes, teeth, lips or other oral mucosa - nerve damage due to positioning  - sore throat or hoarseness - Damage to heart, brain, nerves, lungs, other parts of body or loss of life  Patient voiced understanding and assent.)         Anesthesia Quick Evaluation

## 2023-04-04 NOTE — Inpatient Diabetes Management (Signed)
 Inpatient Diabetes Program Recommendations  AACE/ADA: New Consensus Statement on Inpatient Glycemic Control (2015)  Target Ranges:  Prepandial:   less than 140 mg/dL      Peak postprandial:   less than 180 mg/dL (1-2 hours)      Critically ill patients:  140 - 180 mg/dL    Latest Reference Range & Units 04/03/23 07:40  Hemoglobin A1C 4.8 - 5.6 % 10.4 (H)  251 mg/dl  (H): Data is abnormally high  Latest Reference Range & Units 04/03/23 07:37 04/03/23 11:33 04/03/23 16:47  Glucose-Capillary 70 - 99 mg/dL 161 (H)  5 units Novolog   195 (H)  3 units Novolog   154 (H)  3 units Novolog    (H): Data is abnormally high  Latest Reference Range & Units 04/04/23 09:30  Glucose-Capillary 70 - 99 mg/dL 096 (H)  (H): Data is abnormally high  To ED with GIB  History: DM2   Home DM Meds: Glucovance  2.5/500 mg 2 tablets BID (NOT taking Jardiance  and Trulicity )   Current Orders: Novolog  0-15 units TID ac/hs     PCP is Dr. Norberto Bear ([facility])--Last seen Sept 2024  NPO for Colonoscopy today   Met w/ pt at bedside this AM.  I introduced myself as the Diabetes RN and that I wanted to review his CBGs with him and ask him a few questions.  Pt immediately told me he didn't have any diabetes questions for me and that his CBG last PM was 154 and that he was "OK".  I tried to verify his home Diabetes meds with him and he told me he "already gave that info to someone in the Emergency room" and that he didn't see a need to review it again.  I relayed to pt that his current A1c of 10.4% shows poor control at home and asked him what his last A1c was--pt stated he didn't know.  Stated he takes his meds but only checks CBGs a few times per week--Has CBG meter at home.  Told me he was fine and didn't have questions again.  I politely left the room as pt did not seem fully interested in speaking with me.   --Will follow patient during hospitalization--  Langston Pippins RN, MSN, CDCES Diabetes  Coordinator Inpatient Glycemic Control Team Team Pager: [phone] (8a-5p)

## 2023-04-05 ENCOUNTER — Encounter: Payer: Self-pay | Admitting: Gastroenterology

## 2023-04-05 DIAGNOSIS — K5791 Diverticulosis of intestine, part unspecified, without perforation or abscess with bleeding: Secondary | ICD-10-CM | POA: Diagnosis not present

## 2023-04-05 DIAGNOSIS — K922 Gastrointestinal hemorrhage, unspecified: Secondary | ICD-10-CM | POA: Diagnosis not present

## 2023-04-05 LAB — CBC
HCT: 29.8 % — ABNORMAL LOW (ref 39.0–52.0)
Hemoglobin: 9.6 g/dL — ABNORMAL LOW (ref 13.0–17.0)
MCH: 26.2 pg (ref 26.0–34.0)
MCHC: 32.2 g/dL (ref 30.0–36.0)
MCV: 81.4 fL (ref 80.0–100.0)
Platelets: 161 10*3/uL (ref 150–400)
RBC: 3.66 MIL/uL — ABNORMAL LOW (ref 4.22–5.81)
RDW: 15 % (ref 11.5–15.5)
WBC: 5 10*3/uL (ref 4.0–10.5)
nRBC: 0 % (ref 0.0–0.2)

## 2023-04-05 LAB — BASIC METABOLIC PANEL
Anion gap: 9 (ref 5–15)
BUN: 14 mg/dL (ref 8–23)
CO2: 24 mmol/L (ref 22–32)
Calcium: 8.3 mg/dL — ABNORMAL LOW (ref 8.9–10.3)
Chloride: 107 mmol/L (ref 98–111)
Creatinine, Ser: 1 mg/dL (ref 0.61–1.24)
GFR, Estimated: >60 mL/min (ref >60)
Glucose, Bld: 208 mg/dL — ABNORMAL HIGH (ref 70–99)
Potassium: 3.9 mmol/L (ref 3.5–5.1)
Sodium: 140 mmol/L (ref 135–145)

## 2023-04-05 LAB — MAGNESIUM: Magnesium: 2 mg/dL (ref 1.7–2.4)

## 2023-04-05 NOTE — TOC CM/SW Note (Signed)
Transition of Care [facility]) - Inpatient Brief Assessment   Patient Details  Name: Sean Roy MRN: [id number] Date of Birth: Jun 16, 1955  Transition of Care [facility]) CM/SW Contact:    Margarito Liner, LCSW Phone Number: 04/05/2023, 11:03 AM   Clinical Narrative: Patient has orders to discharge home today. Chart reviewed. No TOC needs identified. CSW signing off.  Transition of Care Asessment: Insurance and Status: Insurance coverage has been reviewed Patient has primary care physician: Yes Home environment has been reviewed: Single family home Prior level of function:: Not documented Prior/Current Home Services: No current home services Social Drivers of Health Review: SDOH reviewed no interventions necessary Readmission risk has been reviewed: Yes Transition of care needs: no transition of care needs at this time

## 2023-04-05 NOTE — Plan of Care (Signed)

## 2023-04-05 NOTE — Discharge Summary (Signed)
Physician Discharge Summary   Patient: Sean Roy MRN: [id number] DOB: 13-Apr-1955  Admit date:     04/03/2023  Discharge date: 04/05/23  Discharge Physician: Lurene Shadow   PCP: Nira Retort   Recommendations at discharge:    Follow up with PCP in 1 week. Return to the ED if symptoms recur.  Discharge Diagnoses: Principal Problem:   GI bleeding Active Problems:   Acute blood loss anemia (ABLA)   Lower GI bleed   Lower gastrointestinal bleed  Resolved Problems:   * No resolved hospital problems. *  Hospital Course:  Sean Roy is a 68 y.o. male with medical history significant for paroxysmal atrial fibrillation and Eliquis, hypertension, hyperlipidemia, type II DM, peptic ulcer disease, asthma, who presented to the hospital with rectal bleeding.     Assessment and Plan:   Acute lower GI bleeding: S/p colonoscopy by Dr. Servando Snare, gastroenterologist.  Colonoscopy showed diverticulosis in the entire colon.  There was also blood in the rectum, sigmoid and descending colon. Rectal bleeding has resolved.      Acute blood loss anemia: Hemoglobin dropped from 12.5-9.8-9.6.  No indication for blood transfusion at this time.      Paroxysmal atrial fibrillation: Eliquis has been held. He has been advised to follow up with PCP or cardiologist to discuss anticoagulation. He's agreeable with this plan. Resume Cardizem      Type II DM: Resume home meds     Comorbidities include hypertension, asthma              Consultants: Neurologist Procedures performed: Colonoscopy Disposition: Home Diet recommendation:  Discharge Diet Orders (From admission, onward)     Start     Ordered   04/05/23 0000  Diet Carb Modified        04/05/23 1024           Cardiac and Carb modified diet DISCHARGE MEDICATION: Allergies as of 04/05/2023   No Known Allergies      Medication List     PAUSE taking these medications    apixaban 5 MG Tabs tablet Wait to  take this until your doctor or other care provider tells you to start again. Commonly known as: ELIQUIS Take 1 tablet (5 mg total) by mouth 2 (two) times daily.       STOP taking these medications    atorvastatin 40 MG tablet Commonly known as: LIPITOR   fluticasone 50 MCG/ACT nasal spray Commonly known as: FLONASE   lisinopril 40 MG tablet Commonly known as: ZESTRIL       TAKE these medications    allopurinol 100 MG tablet Commonly known as: ZYLOPRIM Take 1 tablet (100 mg total) by mouth daily.   diltiazem 180 MG 24 hr capsule Commonly known as: Cardizem CD Take 1 capsule (180 mg total) by mouth daily.   glyBURIDE-metformin 2.5-500 MG tablet Commonly known as: GLUCOVANCE TAKE 2 TABLETS BY MOUTH TWO TIMES DAILY WITH MEALS   ONE TOUCH ULTRA TEST test strip Generic drug: glucose blood Use 1 strip  once a day  [DX 250.00]   Trulicity 1.5 MG/0.5ML Soaj Generic drug: Dulaglutide INJECT SUBCUTANEOUSLY 1.5MG  ONCE A WEEK        Discharge Exam: Filed Weights   04/02/23 1924  Weight: 113.4 kg   GEN: NAD SKIN: Warm and dry EYES: No pallor or icterus ENT: MMM CV: RRR PULM: CTA B ABD: soft, obese, NT, +BS CNS: AAO x 3, non focal EXT: No edema or tenderness   Condition at discharge: good  The results of significant diagnostics from this hospitalization (including imaging, microbiology, ancillary and laboratory) are listed below for reference.   Imaging Studies: No results found.  Microbiology: No results found for this or any previous visit.  Labs: CBC: Recent Labs  Lab 04/02/23 1928 04/03/23 0740 04/03/23 1711 04/03/23 2033 04/04/23 0547 04/05/23 0728  WBC 7.8 5.9  --   --  5.1 5.0  NEUTROABS 3.3  --   --   --   --   --   HGB 12.5* 10.5* 10.5* 11.7* 9.8* 9.6*  HCT 41.2 33.2* 34.1* 36.9* 30.7* 29.8*  MCV 85.8 81.2  --   --  81.9 81.4  PLT 186 182  --   --  161 161   Basic Metabolic Panel: Recent Labs  Lab 04/02/23 1928 04/05/23 0728  NA  136 140  K 4.4 3.9  CL 104 107  CO2 19* 24  GLUCOSE 237* 208*  BUN 20 14  CREATININE 1.13 1.00  CALCIUM 9.1 8.3*  MG  --  2.0   Liver Function Tests: Recent Labs  Lab 04/02/23 1928  AST 28  ALT 34  ALKPHOS 61  BILITOT 0.6  PROT 7.2  ALBUMIN 3.8   CBG: Recent Labs  Lab 04/03/23 1647 04/04/23 0930 04/04/23 1141 04/04/23 1758 04/04/23 2127  GLUCAP 154* 198* 206* 233* 217*    Discharge time spent: greater than 30 minutes.  Signed: Lurene Shadow, MD [facility] 04/05/2023

## 2023-04-06 NOTE — Anesthesia Postprocedure Evaluation (Signed)
Anesthesia Post Note  Patient: Sean Roy  Procedure(s) Performed: COLONOSCOPY WITH PROPOFOL  Patient location during evaluation: Endoscopy Anesthesia Type: General Level of consciousness: awake and alert Pain management: pain level controlled Vital Signs Assessment: post-procedure vital signs reviewed and stable Respiratory status: spontaneous breathing, nonlabored ventilation, respiratory function stable and patient connected to nasal cannula oxygen Cardiovascular status: blood pressure returned to baseline and stable Postop Assessment: no apparent nausea or vomiting Anesthetic complications: no   No notable events documented.   Last Vitals:  Vitals:   04/05/23 0351 04/05/23 0820  BP: 131/78 (!) 146/98  Pulse: 72 80  Resp: 17 18  Temp: 36.8 C 37 C  SpO2: 100% 100%    Last Pain:  Vitals:   04/05/23 0820  TempSrc: Oral  PainSc:                  Lenard Simmer

## 2023-04-30 ENCOUNTER — Ambulatory Visit (INDEPENDENT_AMBULATORY_CARE_PROVIDER_SITE_OTHER): Payer: 59 | Admitting: Dermatology

## 2023-04-30 DIAGNOSIS — L810 Postinflammatory hyperpigmentation: Secondary | ICD-10-CM | POA: Diagnosis not present

## 2023-04-30 DIAGNOSIS — L219 Seborrheic dermatitis, unspecified: Secondary | ICD-10-CM

## 2023-04-30 MED ORDER — MOMETASONE FUROATE 0.1 % EX CREA: TOPICAL_CREAM | CUTANEOUS | 3 refills | Status: DC

## 2023-04-30 MED ORDER — ZORYVE 0.3 % EX FOAM: 1.0000 | Freq: Every day | CUTANEOUS | 6 refills | Status: AC

## 2023-04-30 MED ORDER — MOMETASONE FUROATE 0.1 % EX CREA: TOPICAL_CREAM | CUTANEOUS | 1 refills | Status: AC

## 2023-04-30 NOTE — Patient Instructions (Addendum)
 Your prescription was sent to [facility] in [place]. A representative from [facility] will contact you within 3 business hours to verify your address and insurance information to schedule a free delivery. If for any reason you do not receive a phone call from them, please reach out to them. Their phone number is [phone] and their hours are Monday-Friday 9:00 am-5:00 pm.     Due to recent changes in healthcare laws, you may see results of your pathology and/or laboratory studies on MyChart before the doctors have had a chance to review them. We understand that in some cases there may be results that are confusing or concerning to you. Please understand that not all results are received at the same time and often the doctors may need to interpret multiple results in order to provide you with the best plan of care or course of treatment. Therefore, we ask that you please give [place] 2 business days to thoroughly review all your results before contacting the office for clarification. Should we see a critical lab result, you will be contacted sooner.   If You Need Anything After Your Visit  If you have any questions or concerns for your doctor, please call our main line at [phone] and press option 4 to reach your doctor's medical assistant. If no one answers, please leave a voicemail as directed and we will return your call as soon as possible. Messages left after 4 pm will be answered the following business day.   You may also send [place] a message via MyChart. We typically respond to MyChart messages within 1-2 business days.  For prescription refills, please ask your pharmacy to contact our office. Our fax number is [phone].  If you have an urgent issue when the clinic is closed that cannot wait until the next business day, you can page your doctor at the number below.    Please note that while we do our best to be available for urgent issues outside of office hours, we are not  available 24/7.   If you have an urgent issue and are unable to reach [place], you may choose to seek medical care at your doctor's office, retail clinic, urgent care center, or emergency room.  If you have a medical emergency, please immediately call 911 or go to the emergency department.  Pager Numbers  - Dr. Gwen Pounds: [phone]  - Dr. Roseanne Reno: [phone]  - Dr. Katrinka Blazing: [phone]   In the event of inclement weather, please call our main line at [phone] for an update on the status of any delays or closures.  Dermatology Medication Tips: Please keep the boxes that topical medications come in in order to help keep track of the instructions about where and how to use these. Pharmacies typically print the medication instructions only on the boxes and not directly on the medication tubes.   If your medication is too expensive, please contact our office at [phone] option 4 or send [place] a message through MyChart.   We are unable to tell what your co-pay for medications will be in advance as this is different depending on your insurance coverage. However, we may be able to find a substitute medication at lower cost or fill out paperwork to get insurance to cover a needed medication.   If a prior authorization is required to get your medication covered by your insurance company, please allow [place] 1-2 business days to complete this process.  Drug prices often vary depending on where the prescription is  filled and some pharmacies may offer cheaper prices.  The website www.goodrx.com contains coupons for medications through different pharmacies. The prices here do not account for what the cost may be with help from insurance (it may be cheaper with your insurance), but the website can give you the price if you did not use any insurance.  - You can print the associated coupon and take it with your prescription to the pharmacy.  - You may also stop by our office during regular business hours  and pick up a GoodRx coupon card.  - If you need your prescription sent electronically to a different pharmacy, notify our office through [facility] or by phone at [phone] option 4.     Si Usted Necesita Algo Despus de Su Visita  Tambin puede enviarnos un mensaje a travs de Clinical cytogeneticist. Por lo general respondemos a los mensajes de MyChart en el transcurso de 1 a 2 das hbiles.  Para renovar recetas, por favor pida a su farmacia que se ponga en contacto con nuestra oficina. Annie Sable de fax es [place] [phone].  Si tiene un asunto urgente cuando la clnica est cerrada y que no puede esperar hasta el siguiente da hbil, puede llamar/localizar a su doctor(a) al nmero que aparece a continuacin.   Por favor, tenga en cuenta que aunque hacemos todo lo posible para estar disponibles para asuntos urgentes fuera del horario de [place], no estamos disponibles las 24 horas del da, los 7 [address]  [address] de la [place].   Si tiene un problema urgente y no puede comunicarse con nosotros, puede optar por buscar atencin mdica  en el consultorio de su doctor(a), en una clnica privada, en un centro de atencin urgente o en una sala de emergencias.  Si tiene Engineer, drilling, por favor llame inmediatamente al 911 o vaya a la sala de emergencias.  Nmeros de bper  - Dr. Gwen Pounds: [phone]  - Dra. Roseanne Reno: [phone]  - Dr. Katrinka Blazing: [phone]   En caso de inclemencias del tiempo, por favor llame a Lacy Duverney principal al [phone] para una actualizacin sobre el [place] de cualquier retraso o cierre.  Consejos para la medicacin en dermatologa: Por favor, guarde las cajas en las que vienen los medicamentos de uso tpico para ayudarle a seguir las instrucciones sobre dnde y cmo usarlos. Las farmacias generalmente imprimen las instrucciones del medicamento slo en las cajas y no directamente en los tubos del [place].   Si su medicamento es muy caro, por favor, pngase  en contacto con Rolm Gala llamando al [phone] y presione la opcin 4 o envenos un mensaje a travs de Clinical cytogeneticist.   No podemos decirle cul ser su copago por los medicamentos por adelantado ya que esto es diferente dependiendo de la cobertura de su seguro. Sin embargo, es posible que podamos encontrar un medicamento sustituto a Audiological scientist un formulario para que el seguro cubra el medicamento que se considera necesario.   Si se requiere una autorizacin previa para que su compaa de seguros [place] su medicamento, por favor permtanos de 1 a 2 das hbiles para completar [address]  Los precios de los medicamentos varan con frecuencia dependiendo del Environmental consultant de dnde se surte la receta y alguna farmacias pueden ofrecer precios ms baratos.  El sitio web www.goodrx.com tiene cupones para medicamentos de Health and safety inspector. Los precios aqu no tienen en cuenta lo que podra costar con la ayuda del seguro (puede ser ms barato con su seguro), pero el sitio web puede  darle el precio si no utiliz [employer].  - Puede imprimir el cupn correspondiente y llevarlo con su receta a la farmacia.  - Tambin puede pasar por nuestra oficina durante el horario de atencin regular y Education officer, museum una tarjeta de cupones de GoodRx.  - Si necesita que su receta se enve electrnicamente a una farmacia diferente, informe a nuestra oficina a travs de MyChart de [place] o por telfono llamando al [phone] y presione la opcin 4.

## 2023-04-30 NOTE — Progress Notes (Signed)
   Follow Up Visit   Subjective  Sean Roy is a 68 y.o. male who presents for the following: Yearly  follow up on a rash on his face, patient was treating with Ketoconazole  cream and Tacrolimus  ointment with a fair response, patient ran out of topical medications a few weeks ago.     The following portions of the chart were reviewed this encounter and updated as appropriate: medications, allergies, medical history  Review of Systems:  No other skin or systemic complaints except as noted in HPI or Assessment and Plan.  Objective  Well appearing patient in no apparent distress; mood and affect are within normal limits.  A focused examination was performed of the following areas:face   Relevant exam findings are noted in the Assessment and Plan.    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   Chronic and persistent condition with duration or expected duration over one year. Condition is symptomatic/ bothersome to patient. Not currently at goal.   Seborrheic Dermatitis  -  is a chronic persistent rash characterized by pinkness and scaling most commonly of the mid face but also can occur on the scalp (dandruff), ears; mid chest, mid back and groin.  It tends to be exacerbated by stress and cooler weather.  People who have neurologic disease may experience new onset or exacerbation of existing seborrheic dermatitis.  The condition is not curable but treatable and can be controlled.   Treatment Plan: Start Mometasone  cream apply to dark spots on his face twice a day prn, Caution skin atrophy with long-term use.  Patient advised mometasone  is topical steroid and should not be used long term on the face due to risk of skin thinning.   Topical steroids (such as triamcinolone, fluocinolone, fluocinonide, mometasone , clobetasol, halobetasol, betamethasone,  hydrocortisone) can cause thinning and lightening of the skin if they are used for too long in the same area. Your physician has selected the right strength medicine for your problem and area affected on the body. Please use your medication only as directed by your physician to prevent side effects.    Start Zoryve  foam apply to aas face once a day   If insurance does not cover Zoryve  foam we will restart Tacrolimus  ointment and Ketoconazole  cream as before   Return in about 1 year (around 04/29/2024) for Seborrheic dermatitis .  I, Clara Crisp, CMA, am acting as scribe for Artemio Larry, MD .   Documentation: I have reviewed the above documentation for accuracy and completeness, and I agree with the above.  Artemio Larry, MD

## 2023-08-27 ENCOUNTER — Other Ambulatory Visit: Payer: Self-pay

## 2023-08-27 DIAGNOSIS — L219 Seborrheic dermatitis, unspecified: Secondary | ICD-10-CM

## 2023-08-27 MED ORDER — TACROLIMUS 0.1 % EX OINT: TOPICAL_OINTMENT | CUTANEOUS | 0 refills | Status: AC

## 2023-08-27 MED ORDER — KETOCONAZOLE 2 % EX CREA: TOPICAL_CREAM | CUTANEOUS | 0 refills | Status: AC

## 2023-08-27 NOTE — Progress Notes (Signed)
 Patient called regarding Zoryve . He wants to go back to his RX of Tacrolimus  Ointment and Ketoconazole  Cream. aw

## 2023-11-14 ENCOUNTER — Emergency Department

## 2023-11-14 ENCOUNTER — Emergency Department
Admission: EM | Admit: 2023-11-14 | Discharge: 2023-11-14 | Disposition: A | Discharge status: Home / Self Care | Attending: Emergency Medicine | Admitting: Emergency Medicine

## 2023-11-14 ENCOUNTER — Other Ambulatory Visit: Payer: Self-pay

## 2023-11-14 DIAGNOSIS — I4891 Unspecified atrial fibrillation: Secondary | ICD-10-CM | POA: Diagnosis not present

## 2023-11-14 DIAGNOSIS — I10 Essential (primary) hypertension: Secondary | ICD-10-CM | POA: Diagnosis not present

## 2023-11-14 DIAGNOSIS — Z7901 Long term (current) use of anticoagulants: Secondary | ICD-10-CM | POA: Diagnosis not present

## 2023-11-14 DIAGNOSIS — M25562 Pain in left knee: Secondary | ICD-10-CM | POA: Insufficient documentation

## 2023-11-14 DIAGNOSIS — E119 Type 2 diabetes mellitus without complications: Secondary | ICD-10-CM | POA: Insufficient documentation

## 2023-11-14 MED ORDER — ACETAMINOPHEN 500 MG PO TABS
1000.0000 mg | ORAL_TABLET | Freq: Once | ORAL | Status: AC
  Administered 2023-11-14: 1000 mg via ORAL
  Filled 2023-11-14: qty 2

## 2023-11-14 MED ORDER — OXYCODONE-ACETAMINOPHEN 5-325 MG PO TABS: 1.0000 | ORAL_TABLET | ORAL | 0 refills | Status: DC | PRN

## 2023-11-14 MED ORDER — LIDOCAINE 5 % EX PTCH: 1.0000 | MEDICATED_PATCH | Freq: Two times a day (BID) | CUTANEOUS | 0 refills | Status: DC

## 2023-11-14 MED ORDER — LIDOCAINE 5 % EX PTCH
1.0000 | MEDICATED_PATCH | CUTANEOUS | Status: DC
  Administered 2023-11-14: 1 via TRANSDERMAL
  Filled 2023-11-14: qty 1

## 2023-11-14 NOTE — ED Provider Notes (Signed)
 [facility] Provider Note    Event Date/Time   First MD Initiated Contact with Patient 11/14/23 1124     (approximate)   History   Chief Complaint Knee Pain (Left/)   HPI  Sandor Reisig is a 68 y.o. male with past medical history of hypertension, hyperlipidemia, diabetes, atrial fibrillation on Eliquis , and PUD who presents to the ED complaining of knee pain.  Patient reports that he has had increasing pain in the lateral portion of his left knee for the past 7 days.  He denies any falls or specific injuries to the knee, denies any history of left knee problems.  He has been taking Tylenol  without significant relief, does state he has an appointment with orthopedics in 2 days.     Physical Exam   Triage Vital Signs: ED Triage Vitals  Encounter Vitals Group     BP 11/14/23 1119 (!) 135/110     Girls Systolic BP Percentile --      Girls Diastolic BP Percentile --      Boys Systolic BP Percentile --      Boys Diastolic BP Percentile --      Pulse Rate 11/14/23 1118 85     Resp 11/14/23 1118 18     Temp 11/14/23 1118 98 F (36.7 C)     Temp Source 11/14/23 1118 Oral     SpO2 11/14/23 1118 98 %     Weight --      Height --      Head Circumference --      Peak Flow --      Pain Score 11/14/23 1118 7     Pain Loc --      Pain Education --      Exclude from Growth Chart --     Most recent vital signs: Vitals:   11/14/23 1118 11/14/23 1119  BP:  (!) 135/110  Pulse: 85   Resp: 18   Temp: 98 F (36.7 C)   SpO2: 98%     Constitutional: Alert and oriented. Eyes: Conjunctivae are normal. Head: Atraumatic. Nose: No congestion/rhinnorhea. Mouth/Throat: Mucous membranes are moist.  Cardiovascular: Normal rate, regular rhythm. Grossly normal heart sounds.  2+ radial and DP pulses bilaterally. Respiratory: Normal respiratory effort.  No retractions. Lungs CTAB. Gastrointestinal: Soft and nontender. No distention. Musculoskeletal: Tenderness  noted over the lateral portion of the left knee with no obvious deformity.  No erythema, edema, or warmth noted.  Mild to moderate pain upon range of motion of the knee. Neurologic:  Normal speech and language. No gross focal neurologic deficits are appreciated.    ED Results / Procedures / Treatments   Labs (all labs ordered are listed, but only abnormal results are displayed) Labs Reviewed - No data to display   RADIOLOGY Left knee x-ray reviewed and interpreted by me with no fracture or dislocation.  PROCEDURES:  Critical Care performed: No  Procedures   MEDICATIONS ORDERED IN ED: Medications  lidocaine  (LIDODERM ) 5 % 1 patch (1 patch Transdermal Patch Applied 11/14/23 1147)  acetaminophen  (TYLENOL ) tablet 1,000 mg (1,000 mg Oral Given 11/14/23 1147)     IMPRESSION / MDM / ASSESSMENT AND PLAN / ED COURSE  I reviewed the triage vital signs and the nursing notes.                              68 y.o. male with past medical history of hypertension, hyperlipidemia,  diabetes, atrial fibrillation on Eliquis , and PUD who presents to the ED complaining of left lateral knee pain for the past week.  Patient's presentation is most consistent with acute complicated illness / injury requiring diagnostic workup.  Differential diagnosis includes, but is not limited to, fracture, dislocation, osteoarthritis, septic arthritis, inflammatory arthritis.  Patient nontoxic-appearing and in no acute distress, vital signs remarkable for hypertension but otherwise reassuring.  No signs of septic arthritis on exam and he has focal tenderness over over the lateral knee with no associated edema, doubt DVT.  He is neurovascularly intact distally, will check x-ray and treat symptomatically with Tylenol  and Lidoderm  patch.  Unfortunately, he is not a good candidate for NSAIDs given his history of GI bleeding and anticoagulation on Eliquis .  Knee x-ray is unremarkable, pain improved on reassessment and  patient appropriate for discharge home with outpatient orthopedic follow-up.  He states he has an appointment in 2 days and we will prescribe short course of pain medication.  He was counseled to return to the ED for new or worsening symptoms, patient agrees with plan.      FINAL CLINICAL IMPRESSION(S) / ED DIAGNOSES   Final diagnoses:  Acute pain of left knee     Rx / DC Orders   ED Discharge Orders          Ordered    oxyCODONE -acetaminophen  (PERCOCET) 5-325 MG tablet  Every 4 hours PRN        11/14/23 1238    lidocaine  (LIDODERM ) 5 %  Every 12 hours        11/14/23 1238             Note:  This document was prepared using Dragon voice recognition software and may include unintentional dictation errors.   Willo Dunnings, MD 11/14/23 1239

## 2023-11-14 NOTE — ED Triage Notes (Signed)
 Pt to ED via POV from home. Pt ambulatory with crutches. Pt reports left knee pain x1wk. Pt reports pain is more on lateral aspect of anterior knee.  Pt denies injury. No past knee surgeries or hx of DVT.

## 2024-03-14 ENCOUNTER — Other Ambulatory Visit: Payer: Self-pay

## 2024-03-14 ENCOUNTER — Emergency Department
Admission: EM | Admit: 2024-03-14 | Discharge: 2024-03-14 | Disposition: A | Discharge status: Home / Self Care | Attending: Emergency Medicine | Admitting: Emergency Medicine

## 2024-03-14 ENCOUNTER — Emergency Department

## 2024-03-14 ENCOUNTER — Encounter: Payer: Self-pay | Admitting: Emergency Medicine

## 2024-03-14 DIAGNOSIS — I10 Essential (primary) hypertension: Secondary | ICD-10-CM | POA: Insufficient documentation

## 2024-03-14 DIAGNOSIS — X500XXA Overexertion from strenuous movement or load, initial encounter: Secondary | ICD-10-CM | POA: Diagnosis not present

## 2024-03-14 DIAGNOSIS — E119 Type 2 diabetes mellitus without complications: Secondary | ICD-10-CM | POA: Diagnosis not present

## 2024-03-14 DIAGNOSIS — M25512 Pain in left shoulder: Secondary | ICD-10-CM | POA: Insufficient documentation

## 2024-03-14 DIAGNOSIS — Z7901 Long term (current) use of anticoagulants: Secondary | ICD-10-CM | POA: Diagnosis not present

## 2024-03-14 LAB — CBG MONITORING, ED: Glucose-Capillary: 283 mg/dL — ABNORMAL HIGH (ref 70–99)

## 2024-03-14 MED ORDER — HYDROCODONE-ACETAMINOPHEN 5-325 MG PO TABS: 1.0000 | ORAL_TABLET | Freq: Four times a day (QID) | ORAL | 0 refills | Status: AC | PRN

## 2024-03-14 NOTE — Discharge Instructions (Addendum)
 Follow-up with your primary care provider if any continued problems.  You may use ice or heat to your shoulder as needed for discomfort.  A prescription for hydrocodone  was sent to the pharmacy for you to take as needed for pain.  Do not drive or operate machinery while taking this medication as it could cause drowsiness.

## 2024-03-14 NOTE — ED Triage Notes (Signed)
 Pt reports left shoulder pain x 2 weeks. Reports he moved a freezer by himself approx 2 weeks ago and it has been hurting since then.

## 2024-03-14 NOTE — ED Provider Notes (Signed)
 "  [facility] Provider Note    Event Date/Time   First MD Initiated Contact with Patient 03/14/24 1129     (approximate)   History   Shoulder Pain   HPI  Faraaz Calvario is a 68 y.o. male presents to the ED with complaint of left shoulder pain after moving a freezer 2 weeks ago.  He denies any fall or injury during this time.  He just states that his shoulders been hurting.  Patient has a history of hypertension, diabetes type 2, degenerative arthritis, gouty arthritis.  Patient also currently is taking Eliquis .     Physical Exam   Triage Vital Signs: ED Triage Vitals [03/14/24 1057]  Encounter Vitals Group     BP (!) 168/91     Girls Systolic BP Percentile      Girls Diastolic BP Percentile      Boys Systolic BP Percentile      Boys Diastolic BP Percentile      Pulse Rate 80     Resp 16     Temp (!) 97.5 F (36.4 C)     Temp Source Oral     SpO2 100 %     Weight      Height      Head Circumference      Peak Flow      Pain Score 5     Pain Loc      Pain Education      Exclude from Growth Chart     Most recent vital signs: Vitals:   03/14/24 1057  BP: (!) 168/91  Pulse: 80  Resp: 16  Temp: (!) 97.5 F (36.4 C)  SpO2: 100%     General: Awake, no distress.  Alert, talkative. CV:  Good peripheral perfusion.  Heart rate and rate rhythm. Resp:  Normal effort.  Lungs are clear bilaterally. Abd:  No distention.  Other:  Left shoulder without gross deformity.  Moderate tenderness on palpation generalized to anterior, posterior and AC joint area.  Range of motion is slow and guarded secondary to increased pain.     ED Results / Procedures / Treatments   Labs (all labs ordered are listed, but only abnormal results are displayed) Labs Reviewed  CBG MONITORING, ED - Abnormal; Notable for the following components:      Result Value   Glucose-Capillary 283 (*)    All other components within normal limits      RADIOLOGY Left  shoulder x-ray images were reviewed and interpreted by myself independent of the radiologist is negative for fracture or dislocation.  Radiology report mentions degenerative changes.    PROCEDURES:  Critical Care performed:   Procedures   MEDICATIONS ORDERED IN ED: Medications - No data to display   IMPRESSION / MDM / ASSESSMENT AND PLAN / ED COURSE  I reviewed the triage vital signs and the nursing notes.   Differential diagnosis includes, but is not limited to, degenerative joint disease, left shoulder strain, musculoskeletal pain, bursitis, tendon injury.  Considered but unlikely fracture, dislocation.  68 year old male presents to the ED with complaint of left shoulder pain for 2 weeks after moving a freezer.  X-rays were reassuring and patient was made aware.  He currently is taking Xarelto and his glucose level in the ED nonfasting was 286.  Originally hide I thought that we would give him Decadron and he is aware that this cannot be done because of his blood sugar.  A prescription for hydrocodone  was sent  to the pharmacy for him to take as needed for pain.  He is to follow-up with his PCP if any continued problems or concerns.      Patient's presentation is most consistent with acute complicated illness / injury requiring diagnostic workup.  FINAL CLINICAL IMPRESSION(S) / ED DIAGNOSES   Final diagnoses:  Acute pain of left shoulder     Rx / DC Orders   ED Discharge Orders          Ordered    HYDROcodone -acetaminophen  (NORCO/VICODIN) 5-325 MG tablet  Every 6 hours PRN        03/14/24 1333             Note:  This document was prepared using Dragon voice recognition software and may include unintentional dictation errors.   Saunders Shona CROME, PA-C 03/14/24 1342    Dorothyann Drivers, MD 03/14/24 1829  "

## 2024-05-06 ENCOUNTER — Ambulatory Visit: Payer: Medicare Other | Admitting: Dermatology
